# Patient Record
Sex: Female | Born: 1955 | Race: White | Hispanic: No | Marital: Married | Smoking: Never smoker
Health system: Southern US, Community
[De-identification: ages and names within clinical notes are randomized; demographics above are authoritative.]

## PROBLEM LIST (undated history)

## (undated) DIAGNOSIS — B019 Varicella without complication: Secondary | ICD-10-CM

## (undated) DIAGNOSIS — Z87440 Personal history of urinary (tract) infections: Secondary | ICD-10-CM

## (undated) DIAGNOSIS — E785 Hyperlipidemia, unspecified: Secondary | ICD-10-CM

## (undated) DIAGNOSIS — M810 Age-related osteoporosis without current pathological fracture: Secondary | ICD-10-CM

## (undated) HISTORY — DX: Hyperlipidemia, unspecified: E78.5

## (undated) HISTORY — DX: Age-related osteoporosis without current pathological fracture: M81.0

## (undated) HISTORY — DX: Personal history of urinary (tract) infections: Z87.440

## (undated) HISTORY — DX: Varicella without complication: B01.9

---

## 1998-04-29 ENCOUNTER — Other Ambulatory Visit: Admission: RE | Admit: 1998-04-29 | Discharge: 1998-04-29 | Payer: Self-pay | Admitting: Family Medicine

## 1999-04-29 ENCOUNTER — Other Ambulatory Visit: Admission: RE | Admit: 1999-04-29 | Discharge: 1999-04-29 | Payer: Self-pay | Admitting: Family Medicine

## 2012-05-04 HISTORY — PX: BILATERAL SALPINGOOPHORECTOMY: SHX1223

## 2013-03-07 ENCOUNTER — Ambulatory Visit (INDEPENDENT_AMBULATORY_CARE_PROVIDER_SITE_OTHER): Payer: BC Managed Care – PPO | Admitting: Internal Medicine

## 2013-03-07 ENCOUNTER — Encounter: Payer: Self-pay | Admitting: Internal Medicine

## 2013-03-07 VITALS — BP 116/80 | HR 64 | Temp 98.2°F | Ht 62.75 in | Wt 154.0 lb

## 2013-03-07 DIAGNOSIS — M949 Disorder of cartilage, unspecified: Secondary | ICD-10-CM

## 2013-03-07 DIAGNOSIS — T887XXA Unspecified adverse effect of drug or medicament, initial encounter: Secondary | ICD-10-CM | POA: Insufficient documentation

## 2013-03-07 DIAGNOSIS — E785 Hyperlipidemia, unspecified: Secondary | ICD-10-CM | POA: Insufficient documentation

## 2013-03-07 DIAGNOSIS — M8580 Other specified disorders of bone density and structure, unspecified site: Secondary | ICD-10-CM

## 2013-03-07 DIAGNOSIS — E559 Vitamin D deficiency, unspecified: Secondary | ICD-10-CM | POA: Insufficient documentation

## 2013-03-07 DIAGNOSIS — M899 Disorder of bone, unspecified: Secondary | ICD-10-CM

## 2013-03-07 NOTE — Progress Notes (Signed)
Chief Complaint  Patient presents with  . Establish Care    Needs to establish with a PCP.  Has been on cholesterol medication in the past.  Stopped due to mucle aches and fatgue.    HPI: Patient comes in as new patient visit . Previous care was  In [place]  See hx sheet  orig from [place] and then [place] and now back for husbands job .  Lipid : Initial 232  Then 125  lipitor and 180 range on prava   .  Began on lipitpor and did a good job with numbers but by october had muscle achiness so tried  Pravastatin 10  since feb  And then October  Achy agin so stopped for the last 3 months . Wants to get checked ? What to do . She hasn't been eating and exercises as well because of the recent move but knows what to do. She has no established heart disease diabetes or other vascular disease. There is no family history of premature vascular disease mom is 45 on cholesterol medicine and has hypertension father died complication of rheumatic heart disease at age 65 brother died of lung cancer he was a smoker.  .  Vitamin d was in the 18 range when evaluated 2 years ago for acute chest pain that was felt to be stress she is on OTC vitamin D   2000 per day  ROS: See pertinent positives and negatives per HPI. 12 system review Rash  ? If had shingles  gaver her valtrex. Or not.  Had bilatero salpingooopherectomy  May 14  Cause of roguue period and had cyst . Pathology benign however they left the uterus in she's had some hot flashes although they're getting better and was told at some point taking the over-the-counter cohosh and Astra van. She is really taking it at this time. Hasn't set up with a gynecologist yet since how  Past Medical History  Diagnosis Date  . Hyperlipidemia   . Hx: UTI (urinary tract infection)   . Chicken pox     Family History  Problem Relation Age of Onset  . Hyperlipidemia Mother     age 4 currently alive  . Heart disease Mother   . Hypertension Mother   .  Hyperlipidemia Father   . Heart disease Father     rheumatic died 40  . Cancer Brother     Lung    History   Social History  . Marital Status: Married    Spouse Name: N/A    Number of Children: N/A  . Years of Education: N/A   Social History Main Topics  . Smoking status: Never Smoker   . Smokeless tobacco: Never Used  . Alcohol Use: Yes     Comment: Drinks wine at dinner  . Drug Use: No  . Sexual Activity: Yes   Other Topics Concern  . None   Social History Narrative   7-8 hours of sleep per night   2 people living in the home   No pets   Originally from [place] moved to [place] and in [place] back in [place]   Husbands job [employer]   bs degree   G3P3 kids out of house    Neg ets FA  up to 7 wine per week     Outpatient Encounter Prescriptions as of 03/07/2013  Medication Sig  . aspirin 81 MG tablet Take 81 mg by mouth daily.  . Calcium Citrate (CITRACAL  PO) Take by mouth.  . Cholecalciferol (VITAMIN D3) 1000 UNITS CAPS Take by mouth.  . Multiple Vitamins-Minerals (CENTRUM SILVER ADULT 50+ PO) Take by mouth.    EXAM:  BP 116/80  Pulse 64  Temp(Src) 98.2 F (36.8 C) (Oral)  Ht 5' 2.75" (1.594 m)  Wt 154 lb (69.854 kg)  BMI 27.49 kg/m2  SpO2 98%  LMP 01/04/2010  Body mass index is 27.49 kg/(m^2). Physical Exam: Vital signs reviewed ZOX:WRUEGEN:This is a well-developed well-nourished alert cooperative  female who appears her stated age in no acute distress.  HEENT: normocephalic atraumatic , Eyes: PERRL EOM's full, conjunctiva clear, Nares: paten,t no deformity discharge or tenderness., Ears: no deformity EAC's clear TMs with normal landmarks. Mouth: clear OP, no lesions, edema.  Moist mucous membranes. NECK: supple without masses, thyromegaly or bruits. CHEST/PULM:  Clear to auscultation and percussion breath sounds equal no wheeze , rales or rhonchi.CV: PMI is nondisplaced, S1 S2 no gallops, murmurs, rubs. Peripheral pulses are full without  delay.No JVD .  ABDOMEN: Bowel sounds normal nontender  No guard or rebound, no hepato splenomegal no CVA tenderness.   Extremtities:  No clubbing cyanosis or edema, no acute joint swelling or redness no focal atrophy NEURO:  Oriented x3, cranial nerves 3-12 appear to be intact, no obvious focal weakness,gait within normal limits  SKIN: No acute rashes normal turgor, color, no bruising or petechiae. PSYCH: Oriented, good eye contact, no obvious depression anxiety, cognition and judgment appear normal. LN: no cervicaladenopathy She has laboratory studies and her colonoscopy papers to review from the past.  ASSESSMENT AND PLAN:  Discussed the following assessment and plan:  Other and unspecified hyperlipidemia - Plan: Basic metabolic panel, Lipid panel, TSH, T4, free, Vit D  25 hydroxy (rtn osteoporosis monitoring), Hepatic function panel, Hemoglobin A1c, CBC with Differential, CK  Drug side effects - Plan: Basic metabolic panel, Lipid panel, TSH, T4, free, Vit D  25 hydroxy (rtn osteoporosis monitoring), Hepatic function panel, Hemoglobin A1c, CBC with Differential, CK  Unspecified vitamin D deficiency - Plan: Basic metabolic panel, Lipid panel, TSH, T4, free, Vit D  25 hydroxy (rtn osteoporosis monitoring), Hepatic function panel, Hemoglobin A1c, CBC with Differential, CK  Osteopenia - Plan: Basic metabolic panel, Lipid panel, TSH, T4, free, Vit D  25 hydroxy (rtn osteoporosis monitoring), Hepatic function panel, Hemoglobin A1c, CBC with Differential, CK She is up-to-date on health care maintenance except for mammogram  we'll need Pap smears because she still has a uterus and cervix although her ovaries and fallopian tubes or removed surgically suggest she see a gynecologist because of her recent surgery and they can review her operative pathology notes. -Patient advised to return or notify health care team  if symptoms worsen or persist or new concerns arise.  Patient Instructions    Intensify lifestyle interventions. As dicussed .  Stay on vitamin d . Get app t with GYNE regarding the other issues.  Make a lab appt  For fasting labs  And then plan follow up.    Neta MendsWanda K. Panosh M.D.  Pre visit review using our clinic review tool, if applicable. No additional management support is needed unless otherwise documented below in the visit note. Total visit 45mins > 50% spent counseling and coordinating care

## 2013-03-07 NOTE — Patient Instructions (Signed)
Intensify lifestyle interventions. As dicussed .  Stay on vitamin d . Get app t with GYNE regarding the other issues.  Make a lab appt  For fasting labs  And then plan follow up.

## 2013-03-07 NOTE — Assessment & Plan Note (Signed)
Check level as she had myalgias on statin  Hx of osteopenia

## 2013-03-12 ENCOUNTER — Other Ambulatory Visit (INDEPENDENT_AMBULATORY_CARE_PROVIDER_SITE_OTHER): Payer: BC Managed Care – PPO

## 2013-03-12 DIAGNOSIS — E785 Hyperlipidemia, unspecified: Secondary | ICD-10-CM

## 2013-03-12 DIAGNOSIS — M899 Disorder of bone, unspecified: Secondary | ICD-10-CM

## 2013-03-12 DIAGNOSIS — E559 Vitamin D deficiency, unspecified: Secondary | ICD-10-CM

## 2013-03-12 DIAGNOSIS — M949 Disorder of cartilage, unspecified: Secondary | ICD-10-CM

## 2013-03-12 DIAGNOSIS — T887XXA Unspecified adverse effect of drug or medicament, initial encounter: Secondary | ICD-10-CM

## 2013-03-12 DIAGNOSIS — M8580 Other specified disorders of bone density and structure, unspecified site: Secondary | ICD-10-CM

## 2013-03-12 LAB — CBC WITH DIFFERENTIAL/PLATELET
Basophils Absolute: 0 10*3/uL (ref 0.0–0.1)
Basophils Relative: 0.1 % (ref 0.0–3.0)
EOS ABS: 0.1 10*3/uL (ref 0.0–0.7)
EOS PCT: 1.8 % (ref 0.0–5.0)
HCT: 41.4 % (ref 36.0–46.0)
Hemoglobin: 13.9 g/dL (ref 12.0–15.0)
LYMPHS PCT: 31.1 % (ref 12.0–46.0)
Lymphs Abs: 1.4 10*3/uL (ref 0.7–4.0)
MCHC: 33.7 g/dL (ref 30.0–36.0)
MCV: 91.8 fl (ref 78.0–100.0)
MONO ABS: 0.3 10*3/uL (ref 0.1–1.0)
Monocytes Relative: 7.3 % (ref 3.0–12.0)
NEUTROS PCT: 59.7 % (ref 43.0–77.0)
Neutro Abs: 2.7 10*3/uL (ref 1.4–7.7)
PLATELETS: 274 10*3/uL (ref 150.0–400.0)
RBC: 4.51 Mil/uL (ref 3.87–5.11)
RDW: 14.1 % (ref 11.5–14.6)
WBC: 4.5 10*3/uL (ref 4.5–10.5)

## 2013-03-12 LAB — BASIC METABOLIC PANEL
BUN: 13 mg/dL (ref 6–23)
CHLORIDE: 106 meq/L (ref 96–112)
CO2: 28 mEq/L (ref 19–32)
Calcium: 9.4 mg/dL (ref 8.4–10.5)
Creatinine, Ser: 0.9 mg/dL (ref 0.4–1.2)
GFR: 67.66 mL/min (ref >60.00)
GLUCOSE: 100 mg/dL — AB (ref 70–99)
POTASSIUM: 4.2 meq/L (ref 3.5–5.1)
Sodium: 143 mEq/L (ref 135–145)

## 2013-03-12 LAB — CK: CK TOTAL: 104 U/L (ref 7–177)

## 2013-03-12 LAB — TSH: TSH: 1.33 u[IU]/mL (ref 0.35–5.50)

## 2013-03-12 LAB — LIPID PANEL
Cholesterol: 278 mg/dL — ABNORMAL HIGH (ref 0–200)
HDL: 53.9 mg/dL (ref >39.00)
LDL CALC: 188 mg/dL — AB (ref 0–99)
Total CHOL/HDL Ratio: 5
Triglycerides: 182 mg/dL — ABNORMAL HIGH (ref 0.0–149.0)
VLDL: 36.4 mg/dL (ref 0.0–40.0)

## 2013-03-12 LAB — HEPATIC FUNCTION PANEL
ALT: 22 U/L (ref 0–35)
AST: 24 U/L (ref 0–37)
Albumin: 4.3 g/dL (ref 3.5–5.2)
Alkaline Phosphatase: 72 U/L (ref 39–117)
Bilirubin, Direct: 0 mg/dL (ref 0.0–0.3)
TOTAL PROTEIN: 7.8 g/dL (ref 6.0–8.3)
Total Bilirubin: 0.7 mg/dL (ref 0.3–1.2)

## 2013-03-12 LAB — HEMOGLOBIN A1C: Hgb A1c MFr Bld: 6 % (ref 4.6–6.5)

## 2013-03-12 LAB — T4, FREE: Free T4: 0.65 ng/dL (ref 0.60–1.60)

## 2013-03-13 LAB — VITAMIN D 25 HYDROXY (VIT D DEFICIENCY, FRACTURES): Vit D, 25-Hydroxy: 49 ng/mL (ref 30–89)

## 2013-03-19 ENCOUNTER — Telehealth: Payer: Self-pay | Admitting: Family Medicine

## 2013-03-19 NOTE — Telephone Encounter (Signed)
Try lovastatin 20 mg per day disp 30 refill x 3  Check lipids  In 2-3 months or prn

## 2013-03-19 NOTE — Telephone Encounter (Signed)
Spoke to the pt and gave her the results of her lab work.  She was recently on pravastatin and stopped it due to joint pain.  Has also been on atorvastatin in the past and had to stop taking it due to joint pain.  Would like to know what medication you advise.  Please advise.  Thanks!

## 2013-03-21 NOTE — Telephone Encounter (Signed)
Spoke to the pt.  She would like to know if there is something else she can take besides a statin medication.  They have caused joint/muscle pain in the past.  Please advise.  Thanks!

## 2013-03-27 NOTE — Telephone Encounter (Signed)
Other medicines have not been shown to affect outcomes but could try zetia 10 1 po qd . Disp 30 refill x 3 and repeat lipids as planned

## 2013-03-28 ENCOUNTER — Other Ambulatory Visit: Payer: Self-pay | Admitting: Family Medicine

## 2013-03-28 NOTE — Telephone Encounter (Signed)
Spoke to the pt.  She became anxious and went ahead and started some pravastatin 10 mg that she had at home.  She will call and change to Zetia if she has joint pain like previous.

## 2013-03-28 NOTE — Telephone Encounter (Signed)
Left a message on home phone for the pt to return my call. 

## 2013-06-05 ENCOUNTER — Ambulatory Visit (INDEPENDENT_AMBULATORY_CARE_PROVIDER_SITE_OTHER): Payer: BC Managed Care – PPO | Admitting: Internal Medicine

## 2013-06-05 ENCOUNTER — Encounter: Payer: Self-pay | Admitting: Internal Medicine

## 2013-06-05 VITALS — BP 134/84 | Temp 98.1°F | Ht 62.75 in | Wt 156.0 lb

## 2013-06-05 DIAGNOSIS — H6091 Unspecified otitis externa, right ear: Secondary | ICD-10-CM

## 2013-06-05 DIAGNOSIS — H60399 Other infective otitis externa, unspecified ear: Secondary | ICD-10-CM

## 2013-06-05 DIAGNOSIS — H9209 Otalgia, unspecified ear: Secondary | ICD-10-CM

## 2013-06-05 MED ORDER — OFLOXACIN 0.3 % OT SOLN: 10.0000 [drp] | Freq: Every day | OTIC | Status: DC
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
## 2013-06-05 NOTE — Patient Instructions (Signed)
I agree this is external ear infection or swimmers ear .    Otitis Externa Otitis externa is a bacterial or fungal infection of the outer ear canal. This is the area from the eardrum to the outside of the ear. Otitis externa is sometimes called "swimmer's ear." CAUSES  Possible causes of infection include:  Swimming in dirty water.  Moisture remaining in the ear after swimming or bathing.  Mild injury (trauma) to the ear.  Objects stuck in the ear (foreign body).  Cuts or scrapes (abrasions) on the outside of the ear. SYMPTOMS  The first symptom of infection is often itching in the ear canal. Later signs and symptoms may include swelling and redness of the ear canal, ear pain, and yellowish-white fluid (pus) coming from the ear. The ear pain may be worse when pulling on the earlobe. DIAGNOSIS  Your caregiver will perform a physical exam. A sample of fluid may be taken from the ear and examined for bacteria or fungi. TREATMENT  Antibiotic ear drops are often given for 10 to 14 days. Treatment may also include pain medicine or corticosteroids to reduce itching and swelling. PREVENTION   Keep your ear dry. Use the corner of a towel to absorb water out of the ear canal after swimming or bathing.  Avoid scratching or putting objects inside your ear. This can damage the ear canal or remove the protective wax that lines the canal. This makes it easier for bacteria and fungi to grow.  Avoid swimming in lakes, polluted water, or poorly chlorinated pools.  You may use ear drops made of rubbing alcohol and vinegar after swimming. Combine equal parts of white vinegar and alcohol in a bottle. Put 3 or 4 drops into each ear after swimming. HOME CARE INSTRUCTIONS   Apply antibiotic ear drops to the ear canal as prescribed by your caregiver.  Only take over-the-counter or prescription medicines for pain, discomfort, or fever as directed by your caregiver.  If you have diabetes, follow any  additional treatment instructions from your caregiver.  Keep all follow-up appointments as directed by your caregiver. SEEK MEDICAL CARE IF:   You have a fever.  Your ear is still red, swollen, painful, or draining pus after 3 days.  Your redness, swelling, or pain gets worse.  You have a severe headache.  You have redness, swelling, pain, or tenderness in the area behind your ear. MAKE SURE YOU:   Understand these instructions.  Will watch your condition.  Will get help right away if you are not doing well or get worse. Document Released: 12/21/2004 Document Revised: 03/15/2011 Document Reviewed: 01/07/2011 [facility] Patient Information 2014 [place], [place].

## 2013-06-28 ENCOUNTER — Other Ambulatory Visit (INDEPENDENT_AMBULATORY_CARE_PROVIDER_SITE_OTHER): Payer: BC Managed Care – PPO

## 2013-06-28 DIAGNOSIS — Z0000 Encounter for general adult medical examination without abnormal findings: Secondary | ICD-10-CM

## 2013-06-28 LAB — LIPID PANEL
CHOL/HDL RATIO: 4
Cholesterol: 218 mg/dL — ABNORMAL HIGH (ref 0–200)
HDL: 50.3 mg/dL (ref >39.00)
LDL CALC: 116 mg/dL — AB (ref 0–99)
NONHDL: 167.7
Triglycerides: 258 mg/dL — ABNORMAL HIGH (ref 0.0–149.0)
VLDL: 51.6 mg/dL — AB (ref 0.0–40.0)

## 2013-06-28 LAB — BASIC METABOLIC PANEL
BUN: 13 mg/dL (ref 6–23)
CO2: 29 meq/L (ref 19–32)
CREATININE: 0.9 mg/dL (ref 0.4–1.2)
Calcium: 9.3 mg/dL (ref 8.4–10.5)
Chloride: 107 mEq/L (ref 96–112)
GFR: 73.12 mL/min (ref >60.00)
GLUCOSE: 85 mg/dL (ref 70–99)
Potassium: 4.1 mEq/L (ref 3.5–5.1)
Sodium: 141 mEq/L (ref 135–145)

## 2013-06-28 LAB — CBC WITH DIFFERENTIAL/PLATELET
Basophils Absolute: 0 10*3/uL (ref 0.0–0.1)
Basophils Relative: 0.4 % (ref 0.0–3.0)
Eosinophils Absolute: 0.1 10*3/uL (ref 0.0–0.7)
Eosinophils Relative: 2.3 % (ref 0.0–5.0)
HCT: 40.1 % (ref 36.0–46.0)
HEMOGLOBIN: 13.6 g/dL (ref 12.0–15.0)
LYMPHS PCT: 39 % (ref 12.0–46.0)
Lymphs Abs: 1.7 10*3/uL (ref 0.7–4.0)
MCHC: 33.9 g/dL (ref 30.0–36.0)
MCV: 91.5 fl (ref 78.0–100.0)
MONOS PCT: 8.1 % (ref 3.0–12.0)
Monocytes Absolute: 0.4 10*3/uL (ref 0.1–1.0)
NEUTROS ABS: 2.2 10*3/uL (ref 1.4–7.7)
NEUTROS PCT: 50.2 % (ref 43.0–77.0)
Platelets: 253 10*3/uL (ref 150.0–400.0)
RBC: 4.38 Mil/uL (ref 3.87–5.11)
RDW: 12.8 % (ref 11.5–15.5)
WBC: 4.4 10*3/uL (ref 4.0–10.5)

## 2013-06-28 LAB — HEPATIC FUNCTION PANEL
ALK PHOS: 68 U/L (ref 39–117)
ALT: 20 U/L (ref 0–35)
AST: 25 U/L (ref 0–37)
Albumin: 4.3 g/dL (ref 3.5–5.2)
Bilirubin, Direct: 0 mg/dL (ref 0.0–0.3)
Total Bilirubin: 0.4 mg/dL (ref 0.2–1.2)
Total Protein: 7.4 g/dL (ref 6.0–8.3)

## 2013-06-28 LAB — TSH: TSH: 0.36 u[IU]/mL (ref 0.35–4.50)

## 2013-07-12 ENCOUNTER — Other Ambulatory Visit: Payer: Self-pay | Admitting: Family Medicine

## 2013-07-12 ENCOUNTER — Telehealth: Payer: Self-pay | Admitting: Family Medicine

## 2013-07-12 DIAGNOSIS — E785 Hyperlipidemia, unspecified: Secondary | ICD-10-CM

## 2013-07-12 NOTE — Telephone Encounter (Signed)
Spoke to the pt.  She agreed to nutrition referral.  Order placed in the system.  She would like to know if she should increase her pravastatin (PRAVACHOL) 10 MG tablet.  Will try to work on exercising.

## 2013-07-12 NOTE — Telephone Encounter (Signed)
Try increase pravastatin to 20 mg per daydisp 90 refill x 1   procedd with nutrition referral.  Recheck lipid panel and then rov in3-4 months

## 2013-07-13 ENCOUNTER — Other Ambulatory Visit: Payer: Self-pay | Admitting: Family Medicine

## 2013-07-13 DIAGNOSIS — E785 Hyperlipidemia, unspecified: Secondary | ICD-10-CM

## 2013-07-13 MED ORDER — PRAVASTATIN SODIUM 20 MG PO TABS: 20.0000 mg | ORAL_TABLET | Freq: Every day | ORAL | Status: DC

## 2013-07-13 NOTE — Telephone Encounter (Signed)
Pt notified.  New rx sent to the pharmacy and she has made her future lab appt for 11/07/2013 @ 8:15.

## 2013-07-16 ENCOUNTER — Encounter: Payer: BC Managed Care – PPO | Attending: Internal Medicine | Admitting: Dietician

## 2013-07-16 ENCOUNTER — Encounter: Payer: Self-pay | Admitting: Dietician

## 2013-07-16 VITALS — Ht 63.0 in | Wt 155.0 lb

## 2013-07-16 DIAGNOSIS — E785 Hyperlipidemia, unspecified: Secondary | ICD-10-CM | POA: Diagnosis present

## 2013-07-16 NOTE — Patient Instructions (Addendum)
Find a workout that you enjoy! -Yoga -Biking -Swimming (aerobics) -Hiking -Zumba  -Limit alcohol intake to 1 serving per day

## 2013-07-16 NOTE — Progress Notes (Signed)
  Medical Nutrition Therapy:  Appt start time: 1545 end time:  1630.   Assessment:  Primary concerns today: Samantha Adkins is here today to discuss her weight and hyperlipidemia. She reports that she has struggled with her blood lipid levels for several years. She is currently trying pravastatin for the second time, as she has experienced adverse reactions to statins in the past per patient report. Samantha Adkins expressed concern that her strong family history of high cholesterol and postmenopausal status are contributing to her high cholesterol despite healthy diet.  Preferred Learning Style:   No preference indicated   Learning Readiness:   Ready  Change in progress   MEDICATIONS: pravastatin   DIETARY INTAKE:  24-hr recall:  B ( AM): green tea with mint, oatmeal with banana, flaxseed, and walnuts OR 2 pieces of cinnamon raisin toast  Snk ( AM):   L ( PM): tomato slices with rotisserie chicken with cheese and onion with 2 pieces of whole grain bread Snk ( PM): large apple and almonds D ( PM): spaghetti squash with black beans, tomato, olive oil, mushrooms, basil, avocado OR salad with olive oil and balsamic vinegar Snk ( PM): 1 to 2 pieces of Swiss dark chocolate  Beverages: green tea with mint, red wine, water, light vanilla almond milk   Usual physical activity: daily housework and walking; just started yoga and swimming  Estimated energy needs: 1600-1800 calories 180-200 g carbohydrates 120-135 g protein 44-50 g fat  Progress Towards Goal(s):  In progress.   Nutritional Diagnosis:  Wineglass-2.2 Altered nutrition-related laboratory As related to physical inactivity and family history of hyperlipidemia.  As evidenced by TGs 258,  LDL 116, and TC 218.    Intervention:  Nutrition counseling provided. Encouraged patient to reduce alcohol intake and increase exercise.  Teaching Method Utilized:  Auditory  Handouts given during visit include:  Cholesterol and TGs  handout  Barriers to learning/adherence to lifestyle change: none  Demonstrated degree of understanding via:  Teach Back   Monitoring/Evaluation:  Dietary intake, exercise, and body weight prn.

## 2013-10-19 ENCOUNTER — Other Ambulatory Visit: Payer: Self-pay

## 2013-11-05 ENCOUNTER — Encounter: Payer: Self-pay | Admitting: Dietician

## 2013-11-07 ENCOUNTER — Other Ambulatory Visit (INDEPENDENT_AMBULATORY_CARE_PROVIDER_SITE_OTHER): Payer: BC Managed Care – PPO

## 2013-11-07 DIAGNOSIS — E785 Hyperlipidemia, unspecified: Secondary | ICD-10-CM

## 2013-11-07 LAB — LIPID PANEL
CHOLESTEROL: 237 mg/dL — AB (ref 0–200)
HDL: 60.9 mg/dL (ref >39.00)
LDL Cholesterol: 145 mg/dL — ABNORMAL HIGH (ref 0–99)
NonHDL: 176.1
TRIGLYCERIDES: 157 mg/dL — AB (ref 0.0–149.0)
Total CHOL/HDL Ratio: 4
VLDL: 31.4 mg/dL (ref 0.0–40.0)

## 2013-11-19 MED ORDER — PRAVASTATIN SODIUM 40 MG PO TABS: 40.0000 mg | ORAL_TABLET | Freq: Every day | ORAL | Status: DC

## 2013-11-19 NOTE — Addendum Note (Signed)
Addended by: Raj JanusADKINS, Shalona Harbour T on: 11/19/2013 10:21 AM   Modules accepted: Orders, Medications

## 2013-12-19 ENCOUNTER — Other Ambulatory Visit: Payer: Self-pay

## 2013-12-19 DIAGNOSIS — Z1231 Encounter for screening mammogram for malignant neoplasm of breast: Secondary | ICD-10-CM

## 2014-01-06 ENCOUNTER — Other Ambulatory Visit: Payer: Self-pay | Admitting: Internal Medicine

## 2014-01-07 NOTE — Telephone Encounter (Signed)
Denied.  Filled on 11/19/13 for 6 months

## 2014-01-11 ENCOUNTER — Ambulatory Visit
Admission: RE | Admit: 2014-01-11 | Discharge: 2014-01-11 | Disposition: A | Discharge status: Home / Self Care | Payer: BLUE CROSS/BLUE SHIELD | Source: Ambulatory Visit

## 2014-01-11 DIAGNOSIS — Z1231 Encounter for screening mammogram for malignant neoplasm of breast: Secondary | ICD-10-CM

## 2014-04-22 ENCOUNTER — Telehealth: Payer: Self-pay | Admitting: Internal Medicine

## 2014-04-22 NOTE — Telephone Encounter (Signed)
Pt is scheduled for a physical with labs in August. She is asking if she needs to have any labs done before she see Dr Fabian SharpPanosh in August.

## 2014-04-23 ENCOUNTER — Other Ambulatory Visit: Payer: Self-pay | Admitting: Family Medicine

## 2014-04-23 DIAGNOSIS — Z0000 Encounter for general adult medical examination without abnormal findings: Secondary | ICD-10-CM

## 2014-04-23 NOTE — Telephone Encounter (Signed)
Only cpx labs will be needed.  Orders placed in the system.

## 2014-04-23 NOTE — Telephone Encounter (Signed)
S/w pt

## 2014-05-16 ENCOUNTER — Ambulatory Visit (INDEPENDENT_AMBULATORY_CARE_PROVIDER_SITE_OTHER): Payer: BLUE CROSS/BLUE SHIELD | Admitting: Internal Medicine

## 2014-05-16 ENCOUNTER — Encounter: Payer: Self-pay | Admitting: Internal Medicine

## 2014-05-16 VITALS — BP 112/72 | Temp 98.3°F | Ht 62.5 in | Wt 152.4 lb

## 2014-05-16 DIAGNOSIS — Z79899 Other long term (current) drug therapy: Secondary | ICD-10-CM

## 2014-05-16 DIAGNOSIS — M65312 Trigger thumb, left thumb: Secondary | ICD-10-CM

## 2014-05-16 DIAGNOSIS — F4322 Adjustment disorder with anxiety: Secondary | ICD-10-CM

## 2014-05-16 DIAGNOSIS — R29898 Other symptoms and signs involving the musculoskeletal system: Secondary | ICD-10-CM

## 2014-05-16 DIAGNOSIS — Z8619 Personal history of other infectious and parasitic diseases: Secondary | ICD-10-CM

## 2014-05-16 DIAGNOSIS — E785 Hyperlipidemia, unspecified: Secondary | ICD-10-CM

## 2014-05-16 MED ORDER — VALACYCLOVIR HCL 1 G PO TABS: 2000.0000 mg | ORAL_TABLET | Freq: Two times a day (BID) | ORAL | Status: AC

## 2014-05-16 MED ORDER — ALPRAZOLAM 0.25 MG PO TABS: 0.2500 mg | ORAL_TABLET | Freq: Two times a day (BID) | ORAL | Status: DC | PRN

## 2014-05-16 NOTE — Patient Instructions (Addendum)
This acts like a  Trigger thumb. An may benefit from injection or other  You will be contacted  about a hand referral appt.  Plan wellness visit with  Full labs  ( Can use Friday June 3rd  Work in )

## 2014-05-16 NOTE — Progress Notes (Signed)
Pre visit review using our clinic review tool, if applicable. No additional management support is needed unless otherwise documented below in the visit note.  Chief Complaint  Patient presents with  . Left Thumb Pain    Thumb makes a clicking sound and jaw makes a crackling sound.  Would like refills of valacyclovir and alprazolam.  Would like referral to dermatology and opthalmology.  She would also like to be seen in the next few weeks for CPX and to re check her lipid panel now that pravastatin has been increased.  . Jaw Pain    HPI: Patient Samantha Adkins  comes in today for SDA for  new problem evaluation. Has a number of other issues requests : see above   Onset left thumb  Of problem March 24 or therabouts. Awoke with thumb stuck and click and couldn't do rom  No known injury and couldn't bend . And click and stuck . And aches .  At times   Sometimes ok but then works again has adapted and not using as much cause of this no injury noted   No hx of same .  No hx of arthritis   Out of town for 8 weeks   Plans on being back   [place]   Mom died this past week pulmonary fibrosis idiopathic   Remote hx 2012 of alprazolam 0.25  And had  left over 1 tablet.  From  bro death a few years ago  1/2 helps  Would like to have some on hand; not depressed no reg etoh or rd . Just ocass anxiety .   Gets cold sores  And would likje rx for valtrex used in the past seems to help .  Saw eye doc recently may want to change  Options   Out of town for lots of the summer  Needs cpx   Due for lipid panel and labs   Jaw pain clicks  Right at times  Uncertain if important   Poss teethgritting no injury dental problems  ROS: See pertinent positives and negatives per HPI.  Past Medical History  Diagnosis Date  . Hyperlipidemia   . Hx: UTI (urinary tract infection)   . Chicken pox     Family History  Problem Relation Age of Onset  . Hyperlipidemia Mother     age 59 passed 2016  . Heart disease  Mother   . Hypertension Mother   . Hyperlipidemia Father   . Heart disease Father     rheumatic died 3957  . Cancer Brother     Lung deceased 2012  . Pulmonary fibrosis Mother     History   Social History  . Marital Status: Married    Spouse Name: N/A  . Number of Children: N/A  . Years of Education: N/A   Social History Main Topics  . Smoking status: Never Smoker   . Smokeless tobacco: Never Used  . Alcohol Use: Yes     Comment: Drinks wine at dinner  . Drug Use: No  . Sexual Activity: Yes   Other Topics Concern  . None   Social History Narrative   7-8 hours of sleep per night   2 people living in the home   No pets   [place]   Husbands job [employer]   bs degree   G3P3 kids out of house    Neg ets FA  up to  7 wine per week     Outpatient Prescriptions Prior to Visit  Medication Sig Dispense Refill  . aspirin 81 MG tablet Take 81 mg by mouth daily.    . Calcium Citrate (CITRACAL PO) Take by mouth.    . Cholecalciferol (VITAMIN D3) 1000 UNITS CAPS Take by mouth.    . Coenzyme Q10 (CO Q 10 PO) Take by mouth.    . Multiple Vitamins-Minerals (CENTRUM SILVER ADULT 50+ PO) Take by mouth.    Marland Kitchen. ofloxacin (FLOXIN) 0.3 % otic solution Place 10 drops into the right ear daily. For 7 days 5 mL 1  . pravastatin (PRAVACHOL) 40 MG tablet Take 1 tablet (40 mg total) by mouth daily. 90 tablet 1   No facility-administered medications prior to visit.     EXAM:  BP 112/72 mmHg  Temp(Src) 98.3 F (36.8 C) (Oral)  Ht 5' 2.5" (1.588 m)  Wt 152 lb 6.4 oz (69.128 kg)  BMI 27.41 kg/m2  LMP 01/04/2010  Body mass index is 27.41 kg/(m^2).  GENERAL: vitals reviewed and listed above, alert, oriented, appears well hydrated and in no acute distress HEENT: atraumatic, conjunctiva  clear, no obvious abnormalities on inspection of external nose and ears NECK: no obvious masses on inspection palpation  LMS:  moves all extremities x Left thumb  Clicking tendon but can do rom at this time  mintendernss  PSYCH: pleasant and cooperative, no obvious depression or anxiety  ASSESSMENT AND PLAN:  Discussed the following assessment and plan:  Trigger thumb, left - can try nsaid and dec use but lasting for 3 months refer to hand - Plan: Ambulatory referral to Hand Surgery  Jaw clicking - if problematic can see dentist but prob not sig   Adjustment disorder with anxious mood - risk benefit of med not with etoh rare use mom just died   if persistnet then plan other intervention  Hx of cold sores  Medication management - valtrex with explanation  Hyperlipidemia - due for labs and wellness  resced to earlier in summer   -Patient advised to return or notify health care team  if symptoms worsen ,persist or new concerns arise.  Patient Instructions  This acts like a  Trigger thumb. An may benefit from injection or other  You will be contacted  about a hand referral appt.  Plan wellness visit with  Full labs  ( Can use Friday June 3rd  Work in )     [place]lingdale[place]anda K. Jay Haskew M.D.

## 2014-05-31 ENCOUNTER — Other Ambulatory Visit (INDEPENDENT_AMBULATORY_CARE_PROVIDER_SITE_OTHER): Payer: BLUE CROSS/BLUE SHIELD

## 2014-05-31 DIAGNOSIS — Z0000 Encounter for general adult medical examination without abnormal findings: Secondary | ICD-10-CM

## 2014-05-31 LAB — LIPID PANEL
CHOLESTEROL: 215 mg/dL — AB (ref 0–200)
HDL: 59.6 mg/dL (ref >39.00)
LDL CALC: 130 mg/dL — AB (ref 0–99)
NonHDL: 155.4
Total CHOL/HDL Ratio: 4
Triglycerides: 127 mg/dL (ref 0.0–149.0)
VLDL: 25.4 mg/dL (ref 0.0–40.0)

## 2014-05-31 LAB — CBC WITH DIFFERENTIAL/PLATELET
Basophils Absolute: 0 10*3/uL (ref 0.0–0.1)
Basophils Relative: 0.5 % (ref 0.0–3.0)
EOS ABS: 0.1 10*3/uL (ref 0.0–0.7)
Eosinophils Relative: 2.3 % (ref 0.0–5.0)
HEMATOCRIT: 42.8 % (ref 36.0–46.0)
Hemoglobin: 14.5 g/dL (ref 12.0–15.0)
LYMPHS ABS: 1.7 10*3/uL (ref 0.7–4.0)
Lymphocytes Relative: 36.5 % (ref 12.0–46.0)
MCHC: 33.9 g/dL (ref 30.0–36.0)
MCV: 89.9 fl (ref 78.0–100.0)
MONOS PCT: 8.3 % (ref 3.0–12.0)
Monocytes Absolute: 0.4 10*3/uL (ref 0.1–1.0)
Neutro Abs: 2.5 10*3/uL (ref 1.4–7.7)
Neutrophils Relative %: 52.4 % (ref 43.0–77.0)
Platelets: 261 10*3/uL (ref 150.0–400.0)
RBC: 4.76 Mil/uL (ref 3.87–5.11)
RDW: 13 % (ref 11.5–15.5)
WBC: 4.8 10*3/uL (ref 4.0–10.5)

## 2014-05-31 LAB — BASIC METABOLIC PANEL
BUN: 14 mg/dL (ref 6–23)
CO2: 30 mEq/L (ref 19–32)
Calcium: 9.7 mg/dL (ref 8.4–10.5)
Chloride: 104 mEq/L (ref 96–112)
Creatinine, Ser: 0.95 mg/dL (ref 0.40–1.20)
GFR: 64.1 mL/min (ref >60.00)
Glucose, Bld: 105 mg/dL — ABNORMAL HIGH (ref 70–99)
POTASSIUM: 4.7 meq/L (ref 3.5–5.1)
Sodium: 140 mEq/L (ref 135–145)

## 2014-05-31 LAB — HEPATIC FUNCTION PANEL
ALK PHOS: 72 U/L (ref 39–117)
ALT: 14 U/L (ref 0–35)
AST: 18 U/L (ref 0–37)
Albumin: 4.4 g/dL (ref 3.5–5.2)
BILIRUBIN DIRECT: 0.1 mg/dL (ref 0.0–0.3)
Total Bilirubin: 0.6 mg/dL (ref 0.2–1.2)
Total Protein: 7.7 g/dL (ref 6.0–8.3)

## 2014-05-31 LAB — TSH: TSH: 1.98 u[IU]/mL (ref 0.35–4.50)

## 2014-06-07 ENCOUNTER — Ambulatory Visit (INDEPENDENT_AMBULATORY_CARE_PROVIDER_SITE_OTHER): Payer: BLUE CROSS/BLUE SHIELD | Admitting: Internal Medicine

## 2014-06-07 ENCOUNTER — Encounter: Payer: Self-pay | Admitting: Internal Medicine

## 2014-06-07 VITALS — BP 114/74 | Temp 98.2°F | Ht 62.0 in | Wt 153.1 lb

## 2014-06-07 DIAGNOSIS — Z0000 Encounter for general adult medical examination without abnormal findings: Secondary | ICD-10-CM

## 2014-06-07 DIAGNOSIS — Z8249 Family history of ischemic heart disease and other diseases of the circulatory system: Secondary | ICD-10-CM

## 2014-06-07 DIAGNOSIS — E785 Hyperlipidemia, unspecified: Secondary | ICD-10-CM

## 2014-06-07 DIAGNOSIS — R7301 Impaired fasting glucose: Secondary | ICD-10-CM | POA: Diagnosis not present

## 2014-06-07 DIAGNOSIS — Z79899 Other long term (current) drug therapy: Secondary | ICD-10-CM

## 2014-06-07 NOTE — Patient Instructions (Signed)
Continue lifestyle intervention healthy eating and exercise .  Will look into getting a coronary artery calcium.  Score which may help us decide risk beneft of statin medication.     Why follow it? Research shows. . Those who follow the Mediterranean diet have a reduced risk of heart disease  . The diet is associated with a reduced incidence of Parkinson's and Alzheimer's diseases . People following the diet may have longer life expectancies and lower rates of chronic diseases  . The Dietary Guidelines for Americans recommends the Mediterranean diet as an eating plan to promote health and prevent disease  What Is the Mediterranean Diet?  . Healthy eating plan based on typical foods and recipes of Mediterranean-style cooking . The diet is primarily a plant based diet; these foods should make up a majority of meals   Starches - Plant based foods should make up a majority of meals - They are an important sources of vitamins, minerals, energy, antioxidants, and fiber - Choose whole grains, foods high in fiber and minimally processed items  - Typical grain sources include wheat, oats, barley, corn, [place] rice, bulgar, farro, millet, polenta, couscous  - Various types of beans include chickpeas, lentils, fava beans, black beans, white beans   Fruits  Veggies - Large quantities of antioxidant rich fruits & veggies; 6 or more servings  - Vegetables can be eaten raw or lightly drizzled with oil and cooked  - Vegetables common to the traditional Mediterranean Diet include: artichokes, arugula, beets, broccoli, brussel sprouts, cabbage, carrots, celery, collard greens, cucumbers, eggplant, kale, leeks, lemons, lettuce, mushrooms, okra, onions, peas, peppers, potatoes, pumpkin, radishes, rutabaga, shallots, spinach, sweet potatoes, turnips, zucchini - Fruits common to the Mediterranean Diet include: apples, apricots, avocados, cherries, clementines, dates, figs, grapefruits, grapes, melons, nectarines,  oranges, peaches, pears, pomegranates, strawberries, tangerines  Fats - Replace butter and margarine with healthy oils, such as olive oil, canola oil, and tahini  - Limit nuts to no more than a handful a day  - Nuts include walnuts, almonds, pecans, pistachios, pine nuts  - Limit or avoid candied, honey roasted or heavily salted nuts - Olives are central to the [place]iterranean diet - can be eaten whole or used in a variety of dishes   Meats Protein - Limiting red meat: no more than a few times a month - When eating red meat: choose lean cuts and keep the portion to the size of deck of cards - Eggs: approx. 0 to 4 times a week  - Fish and lean poultry: at least 2 a week  - Healthy protein sources include, chicken, [place], lean beef, lamb - Increase intake of seafood such as tuna, salmon, trout, mackerel, shrimp, scallops - Avoid or limit high fat processed meats such as sausage and bacon  Dairy - Include moderate amounts of low fat dairy products  - Focus on healthy dairy such as fat free yogurt, skim milk, low or reduced fat cheese - Limit dairy products higher in fat such as whole or 2% milk, cheese, ice cream  Alcohol - Moderate amounts of red wine is ok  - No more than 5 oz daily for women (all ages) and men older than age 59  - No more than 10 oz of wine daily for men younger than 3865  Other - Limit sweets and other desserts  - Use herbs and spices instead of salt to flavor foods  - Herbs and spices common to the traditional Mediterranean Diet include: basil, bay leaves,  chives, cloves, cumin, fennel, garlic, lavender, marjoram, mint, oregano, parsley, pepper, rosemary, sage, savory, sumac, tarragon, thyme   It's not just a diet, it's a lifestyle:  . The Mediterranean diet includes lifestyle factors typical of those in the region  . Foods, drinks and meals are best eaten with others and savored . Daily physical activity is important for overall good health . This could be strenuous  exercise like running and aerobics . This could also be more leisurely activities such as walking, housework, yard-work, or taking the stairs . Moderation is the key; a balanced and healthy diet accommodates most foods and drinks . Consider portion sizes and frequency of consumption of certain foods   Meal Ideas & Options:  . Breakfast:  o Whole wheat toast or whole wheat English muffins with peanut butter & hard boiled egg o Steel cut oats topped with apples & cinnamon and skim milk  o Fresh fruit: banana, strawberries, melon, berries, peaches  o Smoothies: strawberries, bananas, greek yogurt, peanut butter o Low fat greek yogurt with blueberries and granola  o Egg white omelet with spinach and mushrooms o Breakfast couscous: whole wheat couscous, apricots, skim milk, cranberries  . Sandwiches:  o Hummus and grilled vegetables (peppers, zucchini, squash) on whole wheat bread   o Grilled chicken on whole wheat pita with lettuce, tomatoes, cucumbers or tzatziki  o Tuna salad on whole wheat bread: tuna salad made with greek yogurt, olives, red peppers, capers, green onions o Garlic rosemary lamb pita: lamb sauted with garlic, rosemary, salt & pepper; add lettuce, cucumber, greek yogurt to pita - flavor with lemon juice and black pepper  . Seafood:  o Mediterranean grilled salmon, seasoned with garlic, basil, parsley, lemon juice and black pepper o Shrimp, lemon, and spinach whole-grain pasta salad made with low fat greek yogurt  o Seared scallops with lemon orzo  o Seared tuna steaks seasoned salt, pepper, coriander topped with tomato mixture of olives, tomatoes, olive oil, minced garlic, parsley, green onions and cappers  . Meats:  o Herbed greek chicken salad with kalamata olives, cucumber, feta  o Red bell peppers stuffed with spinach, bulgur, lean ground beef (or lentils) & topped with feta   o Kebabs: skewers of chicken, tomatoes, onions, zucchini, squash  o [place] burgers: made with  red onions, mint, dill, lemon juice, feta cheese topped with roasted red peppers . Vegetarian o Cucumber salad: cucumbers, artichoke hearts, celery, red onion, feta cheese, tossed in olive oil & lemon juice  o Hummus and whole grain pita points with a greek salad (lettuce, tomato, feta, olives, cucumbers, red onion) o Lentil soup with celery, carrots made with vegetable broth, garlic, salt and pepper  o Tabouli salad: parsley, bulgur, mint, scallions, cucumbers, tomato, radishes, lemon juice, olive oil, salt and pepper.

## 2014-06-07 NOTE — Progress Notes (Signed)
Pre visit review using our clinic review tool, if applicable. No additional management support is needed unless otherwise documented below in the visit note.  Chief Complaint  Patient presents with  . Annual Exam    HPI: Patient  Samantha Adkins  59 y.o. comes in today for Preventive Health Care visit  Also follow-up of hyperlipidemia now on 40 mg of pravastatin Health Maintenance  Topic Date Due  . HIV Screening  06/05/2015 (Originally 11/29/1970)  . INFLUENZA VACCINE  08/05/2014  . PAP SMEAR  05/05/2015  . MAMMOGRAM  01/12/2016  . COLONOSCOPY  09/04/2017  . TETANUS/TDAP  05/05/2019   Health Maintenance Review LIFESTYLE:  Exercise:  Walking some  No physical lmitattions Tobacco/ETS: Alcohol: per day 1-3 ocass  Sugar beverages:no Sleep:ok Drug use: no  ROS:  Thumb is better after injection  GEN/ HEENT: No fever, significant weight changes sweats headaches vision problems hearing changes, CV/ PULM; No chest pain shortness of breath cough, syncope,edema  change in exercise tolerance. GI /GU: No adominal pain, vomiting, change in bowel habits. No blood in the stool. No significant GU symptoms. SKIN/HEME: ,no acute skin rashes suspicious lesions or bleeding. No lymphadenopathy, nodules, masses.  NEURO/ PSYCH:  No neurologic signs such as weakness numbness. No depression anxiety. Not depressed some sadness with bereavement. IMM/ Allergy: No unusual infections.  Allergy .   REST of 12 system review negative except as per HPI   Past Medical History  Diagnosis Date  . Hyperlipidemia   . Hx: UTI (urinary tract infection)   . Chicken pox     Past Surgical History  Procedure Laterality Date  . Bilateral salpingoophorectomy  May 19, 2012    Family History  Problem Relation Age of Onset  . Hyperlipidemia Mother     age 91 passed 2014/05/20  . Heart disease Mother     stents in 42 s   . Hypertension Mother   . Hyperlipidemia Father   . Heart disease Father     rheumatic died 53 chf     . Cancer Brother     Lung deceased 2010/05/20  . Pulmonary fibrosis Mother   . Atrial fibrillation Mother     History   Social History  . Marital Status: Married    Spouse Name: N/A  . Number of Children: N/A  . Years of Education: N/A   Social History Main Topics  . Smoking status: Never Smoker   . Smokeless tobacco: Never Used  . Alcohol Use: Yes     Comment: Drinks wine at dinner  . Drug Use: No  . Sexual Activity: Yes   Other Topics Concern  . None   Social History Narrative   7-8 hours of sleep per night   2 people living in the home   No pets   Originally from [place] moved to [place] and in [place] back in [place]   Husbands job [employer]   bs degree   G3P3 kids out of house    Neg ets FA  up to 7 wine per week     Outpatient Prescriptions Prior to Visit  Medication Sig Dispense Refill  . ALPRAZolam (XANAX) 0.25 MG tablet Take 1 tablet (0.25 mg total) by mouth 2 (two) times daily as needed for anxiety. 20 tablet 0  . aspirin 81 MG tablet Take 81 mg by mouth daily.    . Calcium Citrate (CITRACAL PO) Take by mouth.    . Cholecalciferol (VITAMIN D3) 1000 UNITS CAPS Take by mouth.    Marland Kitchen  Multiple Vitamins-Minerals (CENTRUM SILVER ADULT 50+ PO) Take by mouth.    . pravastatin (PRAVACHOL) 40 MG tablet Take 1 tablet (40 mg total) by mouth daily. 90 tablet 1  . valACYclovir (VALTREX) 1000 MG tablet Take 2 tablets (2,000 mg total) by mouth 2 (two) times daily. For outbreak 30 tablet 1  . Coenzyme Q10 (CO Q 10 PO) Take by mouth.    Marland Kitchen ofloxacin (FLOXIN) 0.3 % otic solution Place 10 drops into the right ear daily. For 7 days 5 mL 1   No facility-administered medications prior to visit.     EXAM:  BP 114/74 mmHg  Temp(Src) 98.2 F (36.8 C) (Oral)  Ht 5\' 2"  (1.575 m)  Wt 153 lb 1.6 oz (69.446 kg)  BMI 28.00 kg/m2  LMP 01/04/2010  Body mass index is 28 kg/(m^2).  Physical Exam: Vital signs reviewed WUJ:WJXB is a well-developed well-nourished  alert cooperative    who appearsr stated age in no acute distress.  HEENT: normocephalic atraumatic , Eyes: PERRL EOM's full, conjunctiva clear, Nares: paten,t no deformity discharge or tenderness., Ears: no deformity EAC's clear TMs with normal landmarks. Mouth: clear OP, no lesions, edema.  Moist mucous membranes. Dentition in adequate repair. NECK: supple without masses, thyromegaly or bruits. CHEST/PULM:  Clear to auscultation and percussion breath sounds equal no wheeze , rales or rhonchi. No chest wall deformities or tenderness. Breast: normal by inspection . No dimpling, discharge, masses, tenderness or discharge . CV: PMI is nondisplaced, S1 S2 no gallops, murmurs, rubs. Peripheral pulses are full without delay.No JVD .  ABDOMEN: Bowel sounds normal nontender  No guard or rebound, no hepato splenomegal no CVA tenderness.  No hernia. Extremtities:  No clubbing cyanosis or edema, no acute joint swelling or redness no focal atrophy NEURO:  Oriented x3, cranial nerves 3-12 appear to be intact, no obvious focal weakness,gait within normal limits no abnormal reflexes or asymmetrical SKIN: No acute rashes normal turgor, color, no bruising or petechiae. PSYCH: Oriented, good eye contact, no obvious depression anxiety, cognition and judgment appear normal. LN: no cervical axillary inguinal adenopathy  Lab Results  Component Value Date   WBC 4.8 05/31/2014   HGB 14.5 05/31/2014   HCT 42.8 05/31/2014   PLT 261.0 05/31/2014   GLUCOSE 105* 05/31/2014   CHOL 215* 05/31/2014   TRIG 127.0 05/31/2014   HDL 59.60 05/31/2014   LDLCALC 130* 05/31/2014   ALT 14 05/31/2014   AST 18 05/31/2014   NA 140 05/31/2014   K 4.7 05/31/2014   CL 104 05/31/2014   CREATININE 0.95 05/31/2014   BUN 14 05/31/2014   CO2 30 05/31/2014   TSH 1.98 05/31/2014   HGBA1C 6.0 03/12/2013    ASSESSMENT AND PLAN:  Discussed the following assessment and plan:  Visit for preventive health examination  Hyperlipidemia  - Plan: CT Cardiac Scoring  Medication management  Fasting hyperglycemia - Plan: CT Cardiac Scoring  Family history of heart disease - Plan: CT Cardiac Scoring Reviewed risk history at length on 40 mg of pravastatin with minimal improvement from 10-20. Her initial LDL was 187 with no other risk factors except family history. Mom had pulmonary fibrosis and had stents in her 10s She has mild hyperglycemia uncertain if related to statin but did predate treatment.   She asks about particle size is risk assessment also. She did go to the nutritionist and felt that was useless because they told her she was doing everything right. Would suggest coronary artery calcium scoring as a better  next step for risk assessment regarding advisability of continued statin intervention versus lifestyle.   tPatient Care Team: Madelin Headings, MD as PCP - General (Internal Medicine) Patient Instructions   Continue lifestyle intervention healthy eating and exercise .  Will look into getting a coronary artery calcium.  Score which may help [place] decide risk beneft of statin medication.     Why follow it? Research shows. . Those who follow the Mediterranean diet have a reduced risk of heart disease  . The diet is associated with a reduced incidence of Parkinson's and Alzheimer's diseases . People following the diet may have longer life expectancies and lower rates of chronic diseases  . The Dietary Guidelines for Americans recommends the Mediterranean diet as an eating plan to promote health and prevent disease  What Is the Mediterranean Diet?  . Healthy eating plan based on typical foods and recipes of Mediterranean-style cooking . The diet is primarily a plant based diet; these foods should make up a majority of meals   Starches - Plant based foods should make up a majority of meals - They are an important sources of vitamins, minerals, energy, antioxidants, and fiber - Choose whole grains, foods high in fiber and  minimally processed items  - Typical grain sources include wheat, oats, barley, corn, [place] rice, bulgar, farro, millet, polenta, couscous  - Various types of beans include chickpeas, lentils, fava beans, black beans, white beans   Fruits  Veggies - Large quantities of antioxidant rich fruits & veggies; 6 or more servings  - Vegetables can be eaten raw or lightly drizzled with oil and cooked  - Vegetables common to the traditional Mediterranean Diet include: artichokes, arugula, beets, broccoli, brussel sprouts, cabbage, carrots, celery, collard greens, cucumbers, eggplant, kale, leeks, lemons, lettuce, mushrooms, okra, onions, peas, peppers, potatoes, pumpkin, radishes, rutabaga, shallots, spinach, sweet potatoes, turnips, zucchini - Fruits common to the Mediterranean Diet include: apples, apricots, avocados, cherries, clementines, dates, figs, grapefruits, grapes, melons, nectarines, oranges, peaches, pears, pomegranates, strawberries, tangerines  Fats - Replace butter and margarine with healthy oils, such as olive oil, canola oil, and tahini  - Limit nuts to no more than a handful a day  - Nuts include walnuts, almonds, pecans, pistachios, pine nuts  - Limit or avoid candied, honey roasted or heavily salted nuts - Olives are central to the [employer] - can be eaten whole or used in a variety of dishes   Meats Protein - Limiting red meat: no more than a few times a month - When eating red meat: choose lean cuts and keep the portion to the size of deck of cards - Eggs: approx. 0 to 4 times a week  - Fish and lean poultry: at least 2 a week  - Healthy protein sources include, chicken, [place], lean beef, lamb - Increase intake of seafood such as tuna, salmon, trout, mackerel, shrimp, scallops - Avoid or limit high fat processed meats such as sausage and bacon  Dairy - Include moderate amounts of low fat dairy products  - Focus on healthy dairy such as fat free yogurt, skim milk, low or  reduced fat cheese - Limit dairy products higher in fat such as whole or 2% milk, cheese, ice cream  Alcohol - Moderate amounts of red wine is ok  - No more than 5 oz daily for women (all ages) and men older than age 55  - No more than 10 oz of wine daily for men younger than 6  Other -  Limit sweets and other desserts  - Use herbs and spices instead of salt to flavor foods  - Herbs and spices common to the traditional Mediterranean Diet include: basil, bay leaves, chives, cloves, cumin, fennel, garlic, lavender, marjoram, mint, oregano, parsley, pepper, rosemary, sage, savory, sumac, tarragon, thyme   It's not just a diet, it's a lifestyle:  . The Mediterranean diet includes lifestyle factors typical of those in the region  . Foods, drinks and meals are best eaten with others and savored . Daily physical activity is important for overall good health . This could be strenuous exercise like running and aerobics . This could also be more leisurely activities such as walking, housework, yard-work, or taking the stairs . Moderation is the key; a balanced and healthy diet accommodates most foods and drinks . Consider portion sizes and frequency of consumption of certain foods   Meal Ideas & Options:  . Breakfast:  o Whole wheat toast or whole wheat English muffins with peanut butter & hard boiled egg o Steel cut oats topped with apples & cinnamon and skim milk  o Fresh fruit: banana, strawberries, melon, berries, peaches  o Smoothies: strawberries, bananas, greek yogurt, peanut butter o Low fat greek yogurt with blueberries and granola  o Egg white omelet with spinach and mushrooms o Breakfast couscous: whole wheat couscous, apricots, skim milk, cranberries  . Sandwiches:  o Hummus and grilled vegetables (peppers, zucchini, squash) on whole wheat bread   o Grilled chicken on whole wheat pita with lettuce, tomatoes, cucumbers or tzatziki  o Tuna salad on whole wheat bread: tuna salad made  with greek yogurt, olives, red peppers, capers, green onions o Garlic rosemary lamb pita: lamb sauted with garlic, rosemary, salt & pepper; add lettuce, cucumber, greek yogurt to pita - flavor with lemon juice and black pepper  . Seafood:  o Mediterranean grilled salmon, seasoned with garlic, basil, parsley, lemon juice and black pepper o Shrimp, lemon, and spinach whole-grain pasta salad made with low fat greek yogurt  o Seared scallops with lemon orzo  o Seared tuna steaks seasoned salt, pepper, coriander topped with tomato mixture of olives, tomatoes, olive oil, minced garlic, parsley, green onions and cappers  . Meats:  o Herbed greek chicken salad with kalamata olives, cucumber, feta  o Red bell peppers stuffed with spinach, bulgur, lean ground beef (or lentils) & topped with feta   o Kebabs: skewers of chicken, tomatoes, onions, zucchini, squash  o [place] burgers: made with red onions, mint, dill, lemon juice, feta cheese topped with roasted red peppers . Vegetarian o Cucumber salad: cucumbers, artichoke hearts, celery, red onion, feta cheese, tossed in olive oil & lemon juice  o Hummus and whole grain pita points with a greek salad (lettuce, tomato, feta, olives, cucumbers, red onion) o Lentil soup with celery, carrots made with vegetable broth, garlic, salt and pepper  o Tabouli salad: parsley, bulgur, mint, scallions, cucumbers, tomato, radishes, lemon juice, olive oil, salt and pepper.         Neta MendsWanda K. Brandyn Lowrey M.D.

## 2014-06-13 ENCOUNTER — Other Ambulatory Visit: Payer: Self-pay | Admitting: Internal Medicine

## 2014-06-13 NOTE — Telephone Encounter (Signed)
Sent to the pharmacy by e-scribe. 

## 2014-06-19 ENCOUNTER — Inpatient Hospital Stay: Admission: RE | Admit: 2014-06-19 | Payer: BLUE CROSS/BLUE SHIELD | Source: Ambulatory Visit

## 2014-06-26 ENCOUNTER — Ambulatory Visit (INDEPENDENT_AMBULATORY_CARE_PROVIDER_SITE_OTHER)
Admission: RE | Admit: 2014-06-26 | Discharge: 2014-06-26 | Disposition: A | Discharge status: Home / Self Care | Payer: BLUE CROSS/BLUE SHIELD | Source: Ambulatory Visit | Attending: Internal Medicine | Admitting: Internal Medicine

## 2014-06-26 DIAGNOSIS — R7301 Impaired fasting glucose: Secondary | ICD-10-CM

## 2014-06-26 DIAGNOSIS — R911 Solitary pulmonary nodule: Secondary | ICD-10-CM

## 2014-06-26 DIAGNOSIS — Z8249 Family history of ischemic heart disease and other diseases of the circulatory system: Secondary | ICD-10-CM

## 2014-06-26 DIAGNOSIS — E785 Hyperlipidemia, unspecified: Secondary | ICD-10-CM

## 2014-06-27 DIAGNOSIS — R911 Solitary pulmonary nodule: Secondary | ICD-10-CM | POA: Insufficient documentation

## 2014-07-01 ENCOUNTER — Other Ambulatory Visit: Payer: Self-pay | Admitting: Family Medicine

## 2014-07-01 ENCOUNTER — Telehealth: Payer: Self-pay | Admitting: Family Medicine

## 2014-07-01 DIAGNOSIS — E785 Hyperlipidemia, unspecified: Secondary | ICD-10-CM

## 2014-07-01 NOTE — Telephone Encounter (Signed)
Unless having a problem with  Side affects would stay on it mostly because of her family history. However I see no compelling reason to switch and change other medication when her calcium score is 0. That is very reassuring.  Okay to repeat lipid panel in 3 months.  Make sure she is aware of the incidental finding of pulmonary nodules. The radiologist recommends repeat limited CT in a year. To ensure they still look benign and are not progressing. Please make sure these orders are in the system.

## 2014-07-01 NOTE — Telephone Encounter (Signed)
Ok to fasting bg and hga1c then

## 2014-07-01 NOTE — Telephone Encounter (Signed)
Pt working hard on her cholesterol. She would like to re check it in three months.  Calcium score in 0 but lipid panel is abnormal.  Does she need to continue medication?

## 2014-07-01 NOTE — Telephone Encounter (Signed)
Pt is also asking for her blood sugar to be checked due to pre diabetes.  Okay to order?

## 2014-07-02 ENCOUNTER — Other Ambulatory Visit: Payer: Self-pay | Admitting: Family Medicine

## 2014-07-02 DIAGNOSIS — R7303 Prediabetes: Secondary | ICD-10-CM

## 2014-07-02 NOTE — Telephone Encounter (Signed)
Labs ordered.

## 2014-08-19 ENCOUNTER — Other Ambulatory Visit: Payer: BLUE CROSS/BLUE SHIELD

## 2014-08-27 ENCOUNTER — Encounter: Payer: BLUE CROSS/BLUE SHIELD | Admitting: Internal Medicine

## 2014-10-09 ENCOUNTER — Other Ambulatory Visit: Payer: BLUE CROSS/BLUE SHIELD

## 2014-10-18 ENCOUNTER — Other Ambulatory Visit: Payer: BLUE CROSS/BLUE SHIELD

## 2014-11-01 ENCOUNTER — Other Ambulatory Visit: Payer: BLUE CROSS/BLUE SHIELD

## 2014-11-15 ENCOUNTER — Other Ambulatory Visit: Payer: BLUE CROSS/BLUE SHIELD

## 2014-11-22 ENCOUNTER — Other Ambulatory Visit: Payer: BLUE CROSS/BLUE SHIELD

## 2014-12-18 ENCOUNTER — Other Ambulatory Visit (INDEPENDENT_AMBULATORY_CARE_PROVIDER_SITE_OTHER): Payer: BLUE CROSS/BLUE SHIELD

## 2014-12-18 DIAGNOSIS — R7303 Prediabetes: Secondary | ICD-10-CM | POA: Diagnosis not present

## 2014-12-18 DIAGNOSIS — E785 Hyperlipidemia, unspecified: Secondary | ICD-10-CM

## 2014-12-18 LAB — LIPID PANEL
Cholesterol: 183 mg/dL (ref 0–200)
HDL: 67.7 mg/dL (ref >39.00)
LDL Cholesterol: 93 mg/dL (ref 0–99)
NONHDL: 115.7
TRIGLYCERIDES: 112 mg/dL (ref 0.0–149.0)
Total CHOL/HDL Ratio: 3
VLDL: 22.4 mg/dL (ref 0.0–40.0)

## 2014-12-18 LAB — GLUCOSE, POCT (MANUAL RESULT ENTRY): POC Glucose: 97 mg/dl (ref 70–99)

## 2014-12-18 LAB — HEMOGLOBIN A1C: HEMOGLOBIN A1C: 5.8 % (ref 4.6–6.5)

## 2015-04-07 ENCOUNTER — Other Ambulatory Visit: Payer: Self-pay

## 2015-04-07 DIAGNOSIS — Z1231 Encounter for screening mammogram for malignant neoplasm of breast: Secondary | ICD-10-CM

## 2015-04-21 ENCOUNTER — Telehealth: Payer: Self-pay | Admitting: Internal Medicine

## 2015-04-21 DIAGNOSIS — M8580 Other specified disorders of bone density and structure, unspecified site: Secondary | ICD-10-CM

## 2015-04-21 DIAGNOSIS — E559 Vitamin D deficiency, unspecified: Secondary | ICD-10-CM

## 2015-04-21 NOTE — Telephone Encounter (Signed)
Would you like to try generic Crestor?

## 2015-04-21 NOTE — Telephone Encounter (Signed)
aks her if she wants to try generic crestor 10 mg per day  or just wait and discuss at next ov?  Please send in dexa order  To  Facility of her choice   Dx estrogen deficient vit d deficient and osteopenia

## 2015-04-21 NOTE — Telephone Encounter (Signed)
Pt call to say she need a bone density test and is saying she need an order from Dr Fabian SharpPanosh.  Pt said she stopped her cholesterol med because it was giving her muscle aches.  Pt would like a call back   [phone]

## 2015-04-22 NOTE — Telephone Encounter (Signed)
Left a message for a return call.

## 2015-04-22 NOTE — Telephone Encounter (Signed)
Spoke to the pt.  [facility]  Order placed.  At this time she will wait and discuss new medication at her next office visit.

## 2015-04-23 ENCOUNTER — Ambulatory Visit: Payer: BLUE CROSS/BLUE SHIELD

## 2015-04-23 ENCOUNTER — Ambulatory Visit
Admission: RE | Admit: 2015-04-23 | Discharge: 2015-04-23 | Disposition: A | Discharge status: Home / Self Care | Payer: BLUE CROSS/BLUE SHIELD | Source: Ambulatory Visit

## 2015-04-23 DIAGNOSIS — Z1231 Encounter for screening mammogram for malignant neoplasm of breast: Secondary | ICD-10-CM

## 2015-04-25 ENCOUNTER — Ambulatory Visit
Admission: RE | Admit: 2015-04-25 | Discharge: 2015-04-25 | Disposition: A | Discharge status: Home / Self Care | Payer: BLUE CROSS/BLUE SHIELD | Source: Ambulatory Visit | Attending: Internal Medicine | Admitting: Internal Medicine

## 2015-04-25 DIAGNOSIS — E559 Vitamin D deficiency, unspecified: Secondary | ICD-10-CM

## 2015-04-25 DIAGNOSIS — M8580 Other specified disorders of bone density and structure, unspecified site: Secondary | ICD-10-CM

## 2015-05-01 NOTE — Progress Notes (Signed)
Pre visit review using our clinic review tool, if applicable. No additional management support is needed unless otherwise documented below in the visit note.  Chief Complaint  Patient presents with  . Follow-up    HPI: Samantha Adkins 60 y.o.  Comes in for fu number of issues has a list of concerns  Last visit   05/08/2014  . LIPIDS:  Began to have se after about 6 mos Stopped pravastatin felt  Better within  Days better after that  myalgias aches and  Memory issues   She has had se of other meds  ? zetia   Mom had  vasc disase In her 17s .  Had angioplasty .  Felt  ...   Had dexa  Mom had hip fx and rx  ROS: See pertinent positives and negatives per HPI. Had bad cough  In [place]    Fu nodule   From last years     Asks about ringing  in ears for years   ? Hearing taking 81 asa  No change would like more eval no dizziness or imbalance with his  Est Vit d 2000 iu per day .  Hx low level   Rare use of alprazolam 2 per year  trigger finger thubm left bothering her hagain  Gets numbness in hands at night ? Positional  No weakness  Asks about getting crp   Past Medical History  Diagnosis Date  . Hyperlipidemia   . Hx: UTI (urinary tract infection)   . Chicken pox     Family History  Problem Relation Age of Onset  . Hyperlipidemia Mother     age 66 passed 05/08/2014  . Heart disease Mother     stents in 68 s   . Hypertension Mother   . Hyperlipidemia Father   . Heart disease Father     rheumatic died 46 chf   . Cancer Brother     Lung deceased 05-08-10  . Pulmonary fibrosis Mother   . Atrial fibrillation Mother     Social History   Social History  . Marital Status: Married    Spouse Name: N/A  . Number of Children: N/A  . Years of Education: N/A   Social History Main Topics  . Smoking status: Never Smoker   . Smokeless tobacco: Never Used  . Alcohol Use: Yes     Comment: Drinks wine at dinner  . Drug Use: No  . Sexual Activity: Yes   Other Topics Concern  . Not on file     Social History Narrative   7-8 hours of sleep per night   2 people living in the home   No pets   Originally from [place] moved to [place] and in [place] back in [place]   Husbands job [employer]   bs degree   G3P3 kids out of house    Neg ets FA  up to 7 wine per week     Outpatient Prescriptions Prior to Visit  Medication Sig Dispense Refill  . aspirin 81 MG tablet Take 81 mg by mouth daily.    . Calcium Citrate (CITRACAL PO) Take by mouth.    . Cholecalciferol (VITAMIN D3) 1000 UNITS CAPS Take by mouth.    . Multiple Vitamins-Minerals (CENTRUM SILVER ADULT 50+ PO) Take by mouth.    . valACYclovir (VALTREX) 1000 MG tablet Take 2 tablets (2,000 mg total) by mouth 2 (two) times daily. For outbreak 30 tablet 1  . ALPRAZolam (XANAX) 0.25 MG  tablet Take 1 tablet (0.25 mg total) by mouth 2 (two) times daily as needed for anxiety. (Patient not taking: Reported on 05/02/2015) 20 tablet 0  . Coenzyme Q10 (CO Q 10 PO) Take by mouth.    . pravastatin (PRAVACHOL) 40 MG tablet TAKE 1 TABLET BY MOUTH EVERY DAY 90 tablet 3   No facility-administered medications prior to visit.     EXAM:  BP 116/82 mmHg  Temp(Src) 98.3 F (36.8 C) (Oral)  Wt 150 lb 14.4 oz (68.448 kg)  LMP 01/04/2010  Body mass index is 27.59 kg/(m^2).  GENERAL: vitals reviewed and listed above, alert, oriented, appears well hydrated and in no acute distress HEENT: atraumatic, conjunctiva  clear, no obvious abnormalities on inspection of external nose and ears OP : no lesion edema or exudate  NECK: no obvious masses on inspection palpation  LUNGS: clear to auscultation bilaterally, no wheezes, rales or rhonchi, good air movement CV: HRRR, no clubbing cyanosis or  peripheral edema nl cap refill  MS: moves all extremities without noticeable focal  Abnormality  Grip ok mild click left  Data reviewed    ASSESSMENT: The BMD measured at Femur Neck Right is 0.693 g/cm2 with a T-score of -2.5. This  patient is considered osteoporotic according to World Health Organization [facility]) criteria. L-3 was excluded due to degenerative change. Per the official positions of the ISCD, it is not possible to quantitatively compare BMD or calculate an [facility] between exams done at different facilities.  Site Region Measured Date Measured Age YA BMD Significant CHANGE T-score DualFemur Neck Right 04/25/2015 59.4 -2.5 0.693 g/cm2  AP Spine L1-L4 (L3) 04/25/2015 59.4 -1.1 1.043 g/cm2  World Health Organization [facility]) criteria for post-menopausal, Caucasian Women: Normal T-score at or above -1 SD Osteopenia T-score between -1 and -2.5 SD Osteoporosis T-score at or below -2.5 SD ASSESSMENT AND PLAN:  Discussed the following assessment and plan:  HLD (hyperlipidemia) - Plan: Basic metabolic panel, Hepatic function panel, Lipid panel, TSH, T4, free, VITAMIN D 25 Hydroxy (Vit-D Deficiency, Fractures), PTH, intact and calcium, Prolactin, CBC with Differential/Platelet, CRP, High Sensitivity, Hemoglobin A1c  Vitamin D deficiency - check level  - Plan: Basic metabolic panel, Hepatic function panel, Lipid panel, TSH, T4, free, VITAMIN D 25 Hydroxy (Vit-D Deficiency, Fractures), PTH, intact and calcium, Prolactin, CBC with Differential/Platelet, CRP, High Sensitivity, Hemoglobin A1c  Trigger thumb, left - recurring  with some arthritis  - Plan: Basic metabolic panel, Hepatic function panel, Lipid panel, TSH, T4, free, VITAMIN D 25 Hydroxy (Vit-D Deficiency, Fractures), PTH, intact and calcium, Prolactin, CBC with Differential/Platelet, CRP, High Sensitivity, Hemoglobin A1c  Medication management - Plan: Basic metabolic panel, Hepatic function panel, Lipid panel, TSH, T4, free, VITAMIN D 25 Hydroxy (Vit-D Deficiency, Fractures), PTH, intact and calcium, Prolactin, CBC with Differential/Platelet, CRP, High Sensitivity, Hemoglobin A1c  Ringing in ear, bilateral - prob need  audiogram  checked and will do ent referral - Plan: Basic metabolic panel, Hepatic function panel, Lipid panel, TSH, T4, free, VITAMIN D 25 Hydroxy (Vit-D Deficiency, Fractures), PTH, intact and calcium, Prolactin, CBC with Differential/Platelet, CRP, High Sensitivity, Hemoglobin A1c, Ambulatory referral to ENT  Fasting hyperglycemia - Plan: Basic metabolic panel, Hepatic function panel, Lipid panel, TSH, T4, free, VITAMIN D 25 Hydroxy (Vit-D Deficiency, Fractures), PTH, intact and calcium, Prolactin, CBC with Differential/Platelet, CRP, High Sensitivity, Hemoglobin A1c  Osteoporosis - by dexa x 1 r hip -2.5  disc pt will repeat at her preovious machine to check trends ,mom had fractures - Plan: Basic  metabolic panel, Hepatic function panel, Lipid panel, TSH, T4, free, VITAMIN D 25 Hydroxy (Vit-D Deficiency, Fractures), PTH, intact and calcium, Prolactin, CBC with Differential/Platelet, CRP, High Sensitivity, Hemoglobin A1c  Drug side effects, initial encounter - statin  myalgias nad mental fogginess - Plan: Basic metabolic panel, Hepatic function panel, Lipid panel, TSH, T4, free, VITAMIN D 25 Hydroxy (Vit-D Deficiency, Fractures), PTH, intact and calcium, Prolactin, CBC with Differential/Platelet, CRP, High Sensitivity, Hemoglobin A1c  Incidental pulmonary nodule - ct scan - Plan: CT Chest Wo Contrast, Basic metabolic panel, Hepatic function panel, Lipid panel, TSH, T4, free, VITAMIN D 25 Hydroxy (Vit-D Deficiency, Fractures), PTH, intact and calcium, Prolactin, CBC with Differential/Platelet, CRP, High Sensitivity, Hemoglobin A1c  Statin intolerance - Plan: Basic metabolic panel, Hepatic function panel, Lipid panel, TSH, T4, free, VITAMIN D 25 Hydroxy (Vit-D Deficiency, Fractures), PTH, intact and calcium, Prolactin, CBC with Differential/Platelet, CRP, High Sensitivity, Hemoglobin A1c  Paresthesia of both hands - nocturnal progressive  poss cts  - Plan: Basic metabolic panel, Hepatic function panel,  Lipid panel, TSH, T4, free, VITAMIN D 25 Hydroxy (Vit-D Deficiency, Fractures), PTH, intact and calcium, Prolactin, CBC with Differential/Platelet, CRP, High Sensitivity, Hemoglobin A1c Pt says cant take statin.   Risk may be more than benefit esp since  Had 0 cacs  In past .  Get lab in a few weeks and then fu  Consider endo consult about the osteoporosis early hip  Plan labs  -Patient advised to return or notify health care team  if symptoms worsen ,persist or new concerns arise.  Total visit 40mins > 50% spent counseling and coordinating care as indicated in above note and in instructions to patient .     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   For the trigger thumb and  Numbness  At night.  Ok to fu dexa   Scan  Comparison.   Consideration of  Seeing endocrinology for conult   Will arrange  Ct scan pulmonary  nodule . Due in JUne  You will be contacted   Referral  To ent for ringing in ears.   Make appt for fasting lab  In about a month  And i will put in orders to include chem vit d lipipds  pth etc    ROV  to discuss  Results if needed .    Neta MendsWanda K. [place] Haub M.D.

## 2015-05-02 ENCOUNTER — Ambulatory Visit (INDEPENDENT_AMBULATORY_CARE_PROVIDER_SITE_OTHER): Payer: BLUE CROSS/BLUE SHIELD | Admitting: Internal Medicine

## 2015-05-02 ENCOUNTER — Encounter: Payer: Self-pay | Admitting: Internal Medicine

## 2015-05-02 VITALS — BP 116/82 | Temp 98.3°F | Wt 150.9 lb

## 2015-05-02 DIAGNOSIS — H9319 Tinnitus, unspecified ear: Secondary | ICD-10-CM | POA: Insufficient documentation

## 2015-05-02 DIAGNOSIS — M65312 Trigger thumb, left thumb: Secondary | ICD-10-CM

## 2015-05-02 DIAGNOSIS — E559 Vitamin D deficiency, unspecified: Secondary | ICD-10-CM | POA: Diagnosis not present

## 2015-05-02 DIAGNOSIS — T887XXA Unspecified adverse effect of drug or medicament, initial encounter: Secondary | ICD-10-CM

## 2015-05-02 DIAGNOSIS — H9313 Tinnitus, bilateral: Secondary | ICD-10-CM

## 2015-05-02 DIAGNOSIS — Z79899 Other long term (current) drug therapy: Secondary | ICD-10-CM

## 2015-05-02 DIAGNOSIS — R7301 Impaired fasting glucose: Secondary | ICD-10-CM

## 2015-05-02 DIAGNOSIS — Z889 Allergy status to unspecified drugs, medicaments and biological substances status: Secondary | ICD-10-CM

## 2015-05-02 DIAGNOSIS — R202 Paresthesia of skin: Secondary | ICD-10-CM

## 2015-05-02 DIAGNOSIS — M810 Age-related osteoporosis without current pathological fracture: Secondary | ICD-10-CM | POA: Insufficient documentation

## 2015-05-02 DIAGNOSIS — Z789 Other specified health status: Secondary | ICD-10-CM | POA: Insufficient documentation

## 2015-05-02 DIAGNOSIS — E785 Hyperlipidemia, unspecified: Secondary | ICD-10-CM | POA: Diagnosis not present

## 2015-05-02 DIAGNOSIS — R911 Solitary pulmonary nodule: Secondary | ICD-10-CM

## 2015-05-02 DIAGNOSIS — T50905A Adverse effect of unspecified drugs, medicaments and biological substances, initial encounter: Secondary | ICD-10-CM

## 2015-05-02 NOTE — Patient Instructions (Signed)
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   For the trigger thumb and  Numbness  At night.  Ok to fu dexa   Scan  Comparison.   Consideration of  Seeing endocrinology for conult   Will arrange  Ct scan pulmonary  nodule . Due in JUne  You will be contacted   Referral  To ent for ringing in ears.   Make appt for fasting lab  In about a month  And i will put in orders to include chem vit d lipipds  pth etc    ROV  to discuss  Results if needed .

## 2015-05-19 ENCOUNTER — Telehealth: Payer: Self-pay | Admitting: Internal Medicine

## 2015-05-19 DIAGNOSIS — M810 Age-related osteoporosis without current pathological fracture: Secondary | ICD-10-CM

## 2015-05-19 NOTE — Telephone Encounter (Signed)
Pt called to ask about a referral to Endocrinology as per her discussion with Dr Fabian SharpPanosh would like a call back

## 2015-05-19 NOTE — Telephone Encounter (Signed)
Please refer dr Elvera LennoxGherghe or other endo  Dx  osteoporosis but dexa   fam hx  Of  fracture

## 2015-05-19 NOTE — Telephone Encounter (Signed)
Pt ask if you would give her a call. She said she cant get in to see the endocrinology till July

## 2015-05-19 NOTE — Telephone Encounter (Signed)
Referral placed in the system. 

## 2015-05-20 NOTE — Telephone Encounter (Signed)
Spoke to the pt.  Dr. Elvera LennoxGherghe can see her in July.  Would like to know if she should wait that long.  Another doctor can see her sooner that that.  She did not name the physician.  Would like to know if she can be seen by the other physician or wait to see Dr. Elvera LennoxGherghe.  Please advise.  Thanks!

## 2015-05-21 NOTE — Telephone Encounter (Addendum)
I think  July is fine    No harm   To her health to wait .  By waiting 2 months .  Also we will have lab results before you appt with her .

## 2015-05-21 NOTE — Telephone Encounter (Signed)
Spoke to the pt and advised ok to wait until July and will have lab work.  Pt agreed.

## 2015-05-22 ENCOUNTER — Ambulatory Visit
Admission: RE | Admit: 2015-05-22 | Discharge: 2015-05-22 | Disposition: A | Discharge status: Home / Self Care | Payer: BLUE CROSS/BLUE SHIELD | Source: Ambulatory Visit | Attending: Internal Medicine | Admitting: Internal Medicine

## 2015-05-22 DIAGNOSIS — R911 Solitary pulmonary nodule: Secondary | ICD-10-CM

## 2015-05-28 LAB — HM DEXA SCAN

## 2015-05-31 ENCOUNTER — Encounter: Payer: Self-pay | Admitting: Internal Medicine

## 2015-06-03 ENCOUNTER — Other Ambulatory Visit: Payer: BLUE CROSS/BLUE SHIELD

## 2015-06-13 ENCOUNTER — Other Ambulatory Visit (INDEPENDENT_AMBULATORY_CARE_PROVIDER_SITE_OTHER): Payer: BLUE CROSS/BLUE SHIELD

## 2015-06-13 ENCOUNTER — Encounter: Payer: Self-pay | Admitting: Family Medicine

## 2015-06-13 DIAGNOSIS — M65312 Trigger thumb, left thumb: Secondary | ICD-10-CM

## 2015-06-13 DIAGNOSIS — E785 Hyperlipidemia, unspecified: Secondary | ICD-10-CM | POA: Diagnosis not present

## 2015-06-13 DIAGNOSIS — T887XXA Unspecified adverse effect of drug or medicament, initial encounter: Secondary | ICD-10-CM

## 2015-06-13 DIAGNOSIS — H9313 Tinnitus, bilateral: Secondary | ICD-10-CM

## 2015-06-13 DIAGNOSIS — Z79899 Other long term (current) drug therapy: Secondary | ICD-10-CM

## 2015-06-13 DIAGNOSIS — M810 Age-related osteoporosis without current pathological fracture: Secondary | ICD-10-CM

## 2015-06-13 DIAGNOSIS — R911 Solitary pulmonary nodule: Secondary | ICD-10-CM

## 2015-06-13 DIAGNOSIS — R202 Paresthesia of skin: Secondary | ICD-10-CM

## 2015-06-13 DIAGNOSIS — R7301 Impaired fasting glucose: Secondary | ICD-10-CM

## 2015-06-13 DIAGNOSIS — E559 Vitamin D deficiency, unspecified: Secondary | ICD-10-CM

## 2015-06-13 DIAGNOSIS — Z889 Allergy status to unspecified drugs, medicaments and biological substances status: Secondary | ICD-10-CM

## 2015-06-13 DIAGNOSIS — Z789 Other specified health status: Secondary | ICD-10-CM

## 2015-06-13 DIAGNOSIS — T50905A Adverse effect of unspecified drugs, medicaments and biological substances, initial encounter: Secondary | ICD-10-CM

## 2015-06-13 LAB — CBC WITH DIFFERENTIAL/PLATELET
Basophils Absolute: 0 10*3/uL (ref 0.0–0.1)
Basophils Relative: 0.6 % (ref 0.0–3.0)
EOS PCT: 2.1 % (ref 0.0–5.0)
Eosinophils Absolute: 0.1 10*3/uL (ref 0.0–0.7)
HCT: 43.1 % (ref 36.0–46.0)
Hemoglobin: 14.4 g/dL (ref 12.0–15.0)
Lymphocytes Relative: 36.9 % (ref 12.0–46.0)
Lymphs Abs: 1.5 10*3/uL (ref 0.7–4.0)
MCHC: 33.4 g/dL (ref 30.0–36.0)
MCV: 90.5 fl (ref 78.0–100.0)
MONOS PCT: 9.1 % (ref 3.0–12.0)
Monocytes Absolute: 0.4 10*3/uL (ref 0.1–1.0)
NEUTROS ABS: 2.1 10*3/uL (ref 1.4–7.7)
NEUTROS PCT: 51.3 % (ref 43.0–77.0)
PLATELETS: 259 10*3/uL (ref 150.0–400.0)
RBC: 4.76 Mil/uL (ref 3.87–5.11)
RDW: 13.4 % (ref 11.5–15.5)
WBC: 4.2 10*3/uL (ref 4.0–10.5)

## 2015-06-13 LAB — HIGH SENSITIVITY CRP: CRP, High Sensitivity: 0.84 mg/L (ref 0.000–5.000)

## 2015-06-13 LAB — BASIC METABOLIC PANEL
BUN: 17 mg/dL (ref 6–23)
CHLORIDE: 104 meq/L (ref 96–112)
CO2: 28 meq/L (ref 19–32)
Calcium: 10 mg/dL (ref 8.4–10.5)
Creatinine, Ser: 0.96 mg/dL (ref 0.40–1.20)
GFR: 63.11 mL/min (ref >60.00)
Glucose, Bld: 93 mg/dL (ref 70–99)
Potassium: 4.7 mEq/L (ref 3.5–5.1)
Sodium: 141 mEq/L (ref 135–145)

## 2015-06-13 LAB — HEPATIC FUNCTION PANEL
ALBUMIN: 4.7 g/dL (ref 3.5–5.2)
ALT: 14 U/L (ref 0–35)
AST: 20 U/L (ref 0–37)
Alkaline Phosphatase: 70 U/L (ref 39–117)
BILIRUBIN DIRECT: 0.1 mg/dL (ref 0.0–0.3)
TOTAL PROTEIN: 7.9 g/dL (ref 6.0–8.3)
Total Bilirubin: 0.7 mg/dL (ref 0.2–1.2)

## 2015-06-13 LAB — LIPID PANEL
Cholesterol: 230 mg/dL — ABNORMAL HIGH (ref 0–200)
HDL: 55.6 mg/dL (ref >39.00)
LDL Cholesterol: 157 mg/dL — ABNORMAL HIGH (ref 0–99)
NonHDL: 174.55
TRIGLYCERIDES: 89 mg/dL (ref 0.0–149.0)
Total CHOL/HDL Ratio: 4
VLDL: 17.8 mg/dL (ref 0.0–40.0)

## 2015-06-13 LAB — HEMOGLOBIN A1C: Hgb A1c MFr Bld: 5.8 % (ref 4.6–6.5)

## 2015-06-13 LAB — T4, FREE: FREE T4: 0.94 ng/dL (ref 0.60–1.60)

## 2015-06-13 LAB — VITAMIN D 25 HYDROXY (VIT D DEFICIENCY, FRACTURES): VITD: 26.65 ng/mL — ABNORMAL LOW (ref 30.00–100.00)

## 2015-06-13 LAB — TSH: TSH: 1.66 u[IU]/mL (ref 0.35–4.50)

## 2015-06-14 LAB — PROLACTIN: PROLACTIN: 3.6 ng/mL

## 2015-06-16 ENCOUNTER — Ambulatory Visit: Payer: BLUE CROSS/BLUE SHIELD | Admitting: Internal Medicine

## 2015-06-16 LAB — PTH, INTACT AND CALCIUM
Calcium: 9.9 mg/dL (ref 8.4–10.5)
PTH: 32 pg/mL (ref 14–64)

## 2015-06-17 NOTE — Progress Notes (Signed)
Pre visit review using our clinic review tool, if applicable. No additional management support is needed unless otherwise documented below in the visit note.  Chief Complaint  Patient presents with  . Follow-up    HPI: Samantha Adkins 60 y.o.  Comes in for fu of labs related to low bone density  Osteoporosis   Had dexa scan here and in [place] ( same place of prev dexa to compare)  She is here for fu results w ?s   Taking calcium and vit d  = about 3000 per day.   Asks about b12 levels not strict vegetarian . Sis has b12 deficiency .  Neg fam hx of celiac  Daughter  Has IBS  Trying off  Statin  And feels better from ms issues  Wants to try lsi ROS: See pertinent positives and negatives per HPI.  Past Medical History  Diagnosis Date  . Hyperlipidemia   . Hx: UTI (urinary tract infection)   . Chicken pox     Family History  Problem Relation Age of Onset  . Hyperlipidemia Mother     age 23 passed May 25, 2014  . Heart disease Mother     stents in 28 s   . Hypertension Mother   . Hyperlipidemia Father   . Heart disease Father     rheumatic died 72 chf   . Cancer Brother     Lung deceased May 25, 2010  . Pulmonary fibrosis Mother   . Atrial fibrillation Mother     Social History   Social History  . Marital Status: Married    Spouse Name: N/A  . Number of Children: N/A  . Years of Education: N/A   Social History Main Topics  . Smoking status: Never Smoker   . Smokeless tobacco: Never Used  . Alcohol Use: Yes     Comment: Drinks wine at dinner  . Drug Use: No  . Sexual Activity: Yes   Other Topics Concern  . Not on file   Social History Narrative   7-8 hours of sleep per night   2 people living in the home   No pets   Originally from [place] moved to [place] and in [place] back in [place]   Husbands job [employer]   bs degree   G3P3 kids out of house    Neg ets FA  up to 7 wine per week     Outpatient Prescriptions Prior to Visit  Medication Sig  Dispense Refill  . aspirin 81 MG tablet Take 81 mg by mouth daily.    . Calcium Citrate (CITRACAL PO) Take by mouth.    . Cholecalciferol (VITAMIN D3) 1000 UNITS CAPS Take by mouth.    . Multiple Vitamins-Minerals (CENTRUM SILVER ADULT 50+ PO) Take by mouth.    . valACYclovir (VALTREX) 1000 MG tablet Take 2 tablets (2,000 mg total) by mouth 2 (two) times daily. For outbreak 30 tablet 1  . ALPRAZolam (XANAX) 0.25 MG tablet Take 1 tablet (0.25 mg total) by mouth 2 (two) times daily as needed for anxiety. (Patient not taking: Reported on 05/02/2015) 20 tablet 0   No facility-administered medications prior to visit.     EXAM:  BP 122/72 mmHg  Temp(Src) 98.3 F (36.8 C) (Oral)  Wt 149 lb 8 oz (67.813 kg)  LMP 01/04/2010  Body mass index is 27.34 kg/(m^2).  GENERAL: vitals reviewed and listed above, alert, oriented, appears well hydrated and in no acute distress PSYCH: pleasant and cooperative, no obvious depression or  anxiety Lab Results  Component Value Date   WBC 4.2 06/13/2015   HGB 14.4 06/13/2015   HCT 43.1 06/13/2015   PLT 259.0 06/13/2015   GLUCOSE 93 06/13/2015   CHOL 230* 06/13/2015   TRIG 89.0 06/13/2015   HDL 55.60 06/13/2015   LDLCALC 157* 06/13/2015   ALT 14 06/13/2015   AST 20 06/13/2015   NA 141 06/13/2015   K 4.7 06/13/2015   CL 104 06/13/2015   CREATININE 0.96 06/13/2015   BUN 17 06/13/2015   CO2 28 06/13/2015   TSH 1.66 06/13/2015   HGBA1C 5.8 06/13/2015  dexa  minn -2.8 rfn ls -2. Th -1.8  Change 10%prev studies frax 2.8 % and 22 %  Scan in to record  See other labs    Mild low vit d  ASSESSMENT AND PLAN:  Discussed the following assessment and plan:  Osteoporosis - young age fam hx no other causes consider  celiac check other per endo  Vitamin D deficiency - vit d3 300- 4000 per day until sees endo dr Reece AgarG  HLD (hyperlipidemia) - try lsi  and reassess  - Plan: Lipid panel  Fasting hyperglycemia - no dm  Spent time discussing calcium serum versus  oral supplements. Vitamin D.Causes of osteoporosis or secondary. Using a bone density to look at risk. Answer questions as best possible. Keep appointment with Dr. Reece AgarG endocrine. Can get repeat lipid panel in the fall on lifestyle intervention as discussed. Total visit 25mins > 50% spent counseling and coordinating care as indicated in above note and in instructions to patient .  -Patient advised to return or notify health care team  if symptoms worsen ,persist or new concerns arise.  Patient Instructions  Take equivalent 4000 iu Vit d per day .  For now .  The scan does confirm early levels of osteoporosis risk    In hip.  In future  Consider celiac panel repeat vit and other as per Dr Elvera LennoxGherghe.   Continue weight  Bearing exercise .   Check lipid panel in  September October    With Continue lifestyle intervention healthy eating and exercise .      Neta MendsWanda K. Mikeila Burgen M.D.

## 2015-06-18 ENCOUNTER — Ambulatory Visit (INDEPENDENT_AMBULATORY_CARE_PROVIDER_SITE_OTHER): Payer: BLUE CROSS/BLUE SHIELD | Admitting: Internal Medicine

## 2015-06-18 ENCOUNTER — Encounter: Payer: Self-pay | Admitting: Internal Medicine

## 2015-06-18 VITALS — BP 122/72 | Temp 98.3°F | Wt 149.5 lb

## 2015-06-18 DIAGNOSIS — E785 Hyperlipidemia, unspecified: Secondary | ICD-10-CM | POA: Diagnosis not present

## 2015-06-18 DIAGNOSIS — E559 Vitamin D deficiency, unspecified: Secondary | ICD-10-CM

## 2015-06-18 DIAGNOSIS — R7301 Impaired fasting glucose: Secondary | ICD-10-CM

## 2015-06-18 DIAGNOSIS — M810 Age-related osteoporosis without current pathological fracture: Secondary | ICD-10-CM | POA: Diagnosis not present

## 2015-06-18 NOTE — Patient Instructions (Addendum)
Take equivalent 4000 iu Vit d per day .  For now .  The scan does confirm early levels of osteoporosis risk    In hip.  In future  Consider celiac panel repeat vit and other as per Dr Elvera LennoxGherghe.   Continue weight  Bearing exercise .   Check lipid panel in  September October    With Continue lifestyle intervention healthy eating and exercise .

## 2015-06-25 ENCOUNTER — Encounter: Payer: BLUE CROSS/BLUE SHIELD | Admitting: Internal Medicine

## 2015-07-29 ENCOUNTER — Ambulatory Visit: Payer: BLUE CROSS/BLUE SHIELD | Admitting: Internal Medicine

## 2015-08-11 ENCOUNTER — Encounter: Payer: Self-pay | Admitting: Internal Medicine

## 2015-08-11 ENCOUNTER — Other Ambulatory Visit: Payer: Self-pay | Admitting: Internal Medicine

## 2015-08-11 ENCOUNTER — Ambulatory Visit (INDEPENDENT_AMBULATORY_CARE_PROVIDER_SITE_OTHER): Payer: BLUE CROSS/BLUE SHIELD | Admitting: Internal Medicine

## 2015-08-11 VITALS — BP 110/72 | HR 75 | Ht 62.0 in | Wt 145.0 lb

## 2015-08-11 DIAGNOSIS — M810 Age-related osteoporosis without current pathological fracture: Secondary | ICD-10-CM | POA: Diagnosis not present

## 2015-08-11 DIAGNOSIS — E559 Vitamin D deficiency, unspecified: Secondary | ICD-10-CM

## 2015-08-11 LAB — VITAMIN D 25 HYDROXY (VIT D DEFICIENCY, FRACTURES): VITD: 43.35 ng/mL (ref 30.00–100.00)

## 2015-08-11 NOTE — Progress Notes (Signed)
Patient ID: Samantha Adkins, female   DOB: 07/04/1955, 60 y.o.   MRN: [id number]    HPI  Samantha Adkins is a 60 y.o.-year-old female, referred by her PCP, Dr. Fabian SharpPanosh, for management of osteoporosis. She also has a h/o vitamin D deficiency.  Pt was dx with OP in 2017.  I reviewed pt's DEXA scans: Date L1-L4 T score FN T score VFA analysis  [place] [place] [place] [facility] L1-L4: -2.0  FN: -2.8  No vertebral fractures from T4 to L4 vertebrae   04/25/2015 ([facility]  L1-L4 (L3): -1.1  RFN: -2.5 LFN: -2.2    In 2014: she was reportedly osteopenic - no records available  She denies fractures or falls.  No dizziness/vertigo/orthostasis/poor vision.  She has not been on the OP treatments.  + h/o vitamin D insufficiency. Last vit D level was: Lab Results  Component Value Date   VD25OH 26.65 (L) 06/13/2015   VD25OH 49 03/12/2013  12/17/2011: VD25OH: 20 04/29/2010: VD 25OH: 31 03/09/2010: VD 25OH: 18  Pt is on vitamin D (2000-2400 units a day). She is on Calcium 600 mg daily.   No weight bearing exercises. She walks daily for 45 min - started this year.   She does not take high vitamin A doses.  Menopause was at 60 y/o. She had oophorectomy in 2014.   Pt does have a FH of osteoporosis in mother at 7770s - she had a hip fx.  No h/o hyper/hypocalcemia or hyperparathyroidism. No h/o kidney stones. Lab Results  Component Value Date   PTH 32 06/13/2015   CALCIUM 10.0 06/13/2015   CALCIUM 9.9 06/13/2015   CALCIUM 9.7 05/31/2014   CALCIUM 9.3 06/28/2013   CALCIUM 9.4 03/12/2013   No h/o thyrotoxicosis. Reviewed TSH recent levels:  Lab Results  Component Value Date   TSH 1.66 06/13/2015   TSH 1.98 05/31/2014   TSH 0.36 06/28/2013   TSH 1.33 03/12/2013   No h/o CKD. Last BUN/Cr: Lab Results  Component Value Date   BUN 17 06/13/2015   CREATININE 0.96 06/13/2015   ROS: Constitutional: + weight gain, no fatigue, + hot flushes, + poor  sleep Eyes: no blurry vision, no xerophthalmia ENT: no sore throat, no nodules palpated in throat, no dysphagia/odynophagia, no hoarseness Cardiovascular: no CP/SOB/palpitations/leg swelling Respiratory: no cough/SOB Gastrointestinal: no N/V/D/C Musculoskeletal: no muscle/joint aches Skin: no rashes Neurological: no tremors/numbness/tingling/dizziness Psychiatric: no depression/anxiety + low libido  Past Medical History:  Diagnosis Date  . Chicken pox   . Hx: UTI (urinary tract infection)   . Hyperlipidemia    Past Surgical History:  Procedure Laterality Date  . BILATERAL SALPINGOOPHORECTOMY  05/2012   Social History   Social History  . Marital status: Married    Spouse name: N/A  . Number of children: 3   Occupational History  . n/a   Social History Main Topics  . Smoking status: Never Smoker  . Smokeless tobacco: Never Used  . Alcohol use Yes     Comment: Drinks wine at dinner  . Drug use: No   Social History Narrative   7-8 hours of sleep per night   2 people living in the home   No pets   [place]   Husbands job [employer]   bs degree   G3P3 kids out of house    Neg ets FA  up to 7 wine per week  Current Outpatient Prescriptions on File Prior to Visit  Medication Sig Dispense Refill  . aspirin 81 MG tablet Take 81 mg by mouth daily.    . Calcium Citrate (CITRACAL PO) Take by mouth.    . Cholecalciferol (VITAMIN D3) 1000 UNITS CAPS Take by mouth.    . Multiple Vitamins-Minerals (CENTRUM SILVER ADULT 50+ PO) Take by mouth.    . ALPRAZolam (XANAX) 0.25 MG tablet Take 1 tablet (0.25 mg total) by mouth 2 (two) times daily as needed for anxiety. (Patient not taking: Reported on 05/02/2015) 20 tablet 0  . valACYclovir (VALTREX) 1000 MG tablet Take 2 tablets (2,000 mg total) by mouth 2 (two) times daily. For outbreak (Patient not taking: Reported on 08/11/2015) 30 tablet 1   No current  facility-administered medications on file prior to visit.    Allergies  Allergen Reactions  . Statins     Muscle aches and fatigue  lipitor and pravastatin 10  Only    Family History  Problem Relation Age of Onset  . Hyperlipidemia Mother     age 42 passed 05/14/2014  . Heart disease Mother     stents in 48 s   . Hypertension Mother   . Hyperlipidemia Father   . Heart disease Father     rheumatic died 41 chf   . Cancer Brother     Lung deceased May 14, 2010  . Pulmonary fibrosis Mother   . Atrial fibrillation Mother     PE: BP 110/72 (BP Location: Left Arm, Patient Position: Sitting)   Pulse 75   Ht  (1.575 m)   Wt 145 lb (65.8 kg)   LMP 01/04/2010   SpO2 96%   BMI 26.52 kg/m  Wt Readings from Last 3 Encounters:  08/11/15 145 lb (65.8 kg)  06/18/15 149 lb 8 oz (67.8 kg)  05/02/15 150 lb 14.4 oz (68.4 kg)   Constitutional: normal weight, in NAD. No kyphosis. Eyes: PERRLA, EOMI, no exophthalmos ENT: moist mucous membranes, no thyromegaly, no cervical lymphadenopathy Cardiovascular: RRR, No MRG Respiratory: CTA B Gastrointestinal: abdomen soft, NT, ND, BS+ Musculoskeletal: no deformities, strength intact in all 4 Skin: moist, warm, no rashes Neurological: no tremor with outstretched hands, DTR normal in all 4  Assessment: 1. Osteoporosis  Plan: 1. Osteoporosis - likely postmenopausal. I explained that some women lose bone mineral density fast after menopause, some more slowly, and some not at all. - Discussed about increased risk of fracture, depending on the T score, greatly increased when the T score is lower than -2.5, but it is actually a continuum and -2.5 should not be regarded as an absolute threshold. We reviewed her DEXA scan report together, and I explained that based on the T scores, she has an increased risk for fractures.  - we reviewed her dietary and supplemental calcium and vitamin D intake. I recommended to make sure she gets 1000-1200 mg of calcium daily  and I will check vit D today to see if she needs further supplementation - given her specific instructions about food sources for Calcium and Vitamin D - see pt instructions  - discussed fall precautions   - given handout from [facility] Re: weight bearing exercises - advised to do this every day or at least 5/7 days - we discussed about maintaining a good amount of protein in her diet. The recommended daily protein intake is ~0.8 g per kilogram per day. I advised her to try to aim for this amount, since a diet low in  proteins can exacerbate osteoporosis. Also, avoid smoking or >2 drinks of alcohol a day. - We discussed about the different medication classes, benefits and side effects (including atypical fractures and ONJ - no dental workup in progress or planned).  - I explained that, since she tells me that she often forgets medicines, I would not use oral bisphosphonates, so my first choice would be sq denosumab (Prolia) for 3-6 years, then zoledronic acid (iv Reclast) for 1-2 years. I would use Teriparatide as a last resort. Pt was given reading information about Prolia and Reclast , and I explained the mechanism of action and expected benefits. However, she is reticent to start medications for now. - I will check the following labs for now:  Vitamin d  Celiac panel  NTX - I would be more prone to start antiresorptives if her NTX were high, since this implies increased bone resorption - will check a new DEXA scan in one year regardless of whether we start medicines are not. - will see pt back in a year  - time spent with the patient: 1 hour, of which >50% was spent in obtaining information about her symptoms, reviewing her previous labs, evaluations, and treatments, counseling her about her condition (please see the discussed topics above), and developing a plan to further investigate it; she had a number of questions which I addressed.  Component     Latest Ref Rng &  Units 08/11/2015  VITD     30.00 - 100.00 ng/mL 43.35  Vitamin D is normal.  Component     Latest Ref Rng & Units 08/11/2015  IgA     81 - 463 mg/dL 161  Endomysial Screen     NEGATIVE NEGATIVE  Tissue Transglutaminase Ab, IgA     <4 U/mL 1  Deamidated Gliadin Abs, IgG     <20 Units 3  Gliadin IgA     <20 Units 11  Tissue Transglut Ab     <6 U/mL 1  Negative celiac panel. N-telopeptide is elevated, indicating high bone turnover, therefore, she would benefit from Prolia or Reclast.  Carlus Pavlov, MD PhD [facility] Endocr[facility]

## 2015-08-11 NOTE — Patient Instructions (Addendum)
Please stop [facility].  Please stop at the lab.  Please return in 1 year.  How Can I Prevent Falls? Men and women with osteoporosis need to take care not to fall down. Falls can break bones. Some reasons people fall are: Poor vision  Poor balance  Certain diseases that affect how you walk  Some types of medicine, such as sleeping pills.  Some tips to help prevent falls outdoors are: Use a cane or walker  Wear rubber-soled shoes so you don't slip  Walk on grass when sidewalks are slippery  In winter, put salt or kitty litter on icy sidewalks.  Some ways to help prevent falls indoors are: Keep rooms free of clutter, especially on floors  Use plastic or carpet runners on slippery floors  Wear low-heeled shoes that provide good support  Do not walk in socks, stockings, or slippers  Be sure carpets and area rugs have skid-proof backs or are tacked to the floor  Be sure stairs are well lit and have rails on both sides  Put grab bars on bathroom walls near tub, shower, and toilet  Use a rubber bath mat in the shower or tub  Keep a flashlight next to your bed  Use a sturdy step stool with a handrail and wide steps  Add more lights in rooms (and night lights) Buy a cordless phone to keep with you so that you don't have to rush to the phone       when it rings and so that you can call for help if you fall.   (adapted from http://www.niams.NightlifePreviews.se)  Dietary sources of calcium and vitamin D:  Calcium content (mg) - http://www.niams.MoviePins.co.za  Fortified oatmeal, 1 packet 350  Sardines, canned in oil, with edible bones, 3 oz. 324  Cheddar cheese, 1 oz. shredded 306  Milk, nonfat, 1 cup 302  Milkshake, 1 cup 300  Yogurt, plain, low-fat, 1 cup 300  Soybeans, cooked, 1 cup 261  Tofu, firm, with calcium,  cup 204  Orange juice, fortified with calcium, 6 oz. 200-260 (varies)  Salmon, canned, with edible bones, 3  oz. 181  Pudding, instant, made with 2% milk,  cup 153  Baked beans, 1 cup [place], 1% milk fat, 1 cup 138  Spaghetti, lasagna, 1 cup 125  Frozen yogurt, vanilla, soft-serve,  cup 103  Ready-to-eat cereal, fortified with calcium, 1 cup 100-1,000 (varies)  Cheese pizza, 1 slice 544  Fortified waffles, 2 100  Turnip greens, boiled,  cup 99  Broccoli, raw, 1 cup 90  Ice cream, vanilla,  cup 85  Soy or rice milk, fortified with calcium, 1 cup 80-500 (varies)   Vitamin D content (International Units, IU) - https://www.ars.usda.gov Cod liver oil, 1 tablespoon 1,360  Swordfish, cooked, 3 oz 566  Salmon (sockeye), cooked, 3 oz 447  Tuna fish, canned in water, drained, 3 oz 154  Orange juice fortified with vitamin D, 1 cup (check product labels, as amount of added vitamin D varies) 137  Milk, nonfat, reduced fat, and whole, vitamin D-fortified, 1 cup 115-124  Yogurt, fortified with 20% of the daily value for vitamin D, 6 oz 80  Margarine, fortified, 1 tablespoon 60  Sardines, canned in oil, drained, 2 sardines 46  Liver, beef, cooked, 3 oz 42  Egg, 1 large (vitamin D is found in yolk) 41  Ready-to-eat cereal, fortified with 10% of the daily value for vitamin D, 0.75-1 cup  40  Cheese, Swiss, 1 oz 6   Exercise  for Strong Bones (from [place]) There are two types of exercises that are important for building and maintaining bone density:  weight-bearing and muscle-strengthening exercises. Weight-bearing Exercises These exercises include activities that make you move against gravity while staying upright. Weight-bearing exercises can be high-impact or low-impact. High-impact weight-bearing exercises help build bones and keep them strong. If you have broken a bone due to osteoporosis or are at risk of breaking a bone, you may need to avoid high-impact exercises. If youre not sure, you should check with your healthcare provider. Examples of high-impact  weight-bearing exercises are:  Dancing  Doing high-impact aerobics  Hiking  Jogging/running  Jumping Rope  Stair climbing  Tennis Low-impact weight-bearing exercises can also help keep bones strong and are a safe alternative if you cannot do high-impact exercises. Examples of low-impact weight-bearing exercises are:  Using elliptical training machines  Doing low-impact aerobics  Using stair-step machines  Fast walking on a treadmill or outside Muscle-Strengthening Exercises These exercises include activities where you move your body, a weight or some other resistance against gravity. They are also known as resistance exercises and include:  Lifting weights  Using elastic exercise bands  Using weight machines  Lifting your own body weight  Functional movements, such as standing and rising up on your toes Yoga and Pilates can also improve strength, balance and flexibility. However, certain positions may not be safe for people with osteoporosis or those at increased risk of broken bones. For example, exercises that have you bend forward may increase the chance of breaking a bone in the spine. A physical therapist should be able to help you learn which exercises are safe and appropriate for you. Non-Impact Exercises Non-impact exercises can help you to improve balance, posture and how well you move in everyday activities. These exercises can also help to increase muscle strength and decrease the risk of falls and broken bones. Some of these exercises include:  Balance exercises that strengthen your legs and test your balance, such as Tai Chi, can decrease your risk of falls.  Posture exercises that improve your posture and reduce rounded or sloping shoulders can help you decrease the chance of breaking a bone, especially in the spine.  Functional exercises that improve how well you move can help you with everyday activities and decrease your chance of falling and breaking a  bone. For example, if you have trouble getting up from a chair or climbing stairs, you should do these activities as exercises. A physical therapist can teach you balance, posture and functional exercises. Starting a New Exercise Program If you havent exercised regularly for a while, check with your healthcare provider before beginning a new exercise program--particularly if you have health problems such as heart disease, diabetes or high blood pressure. If youre at high risk of breaking a bone, you should work with a physical therapist to develop a safe exercise program. Once you have your healthcare providers approval, start slowly. If youve already broken bones in the spine because of osteoporosis, be very careful to avoid activities that require reaching down, bending forward, rapid twisting motions, heavy lifting and those that increase your chance of a fall. As you get started, your muscles may feel sore for a day or two after you exercise. If soreness lasts longer, you may be working too hard and need to ease up. Exercises should be done in a pain-free range of motion. How Much Exercise Do You Need? Weight-bearing exercises 30 minutes on most days of  the week. Do a 30-minutesession or multiple sessions spread out throughout the day. The benefits to your bones are the same.   Muscle-strengthening exercises Two to three days per week. If you dont have much time for strengthening/resistance training, do small amounts at a time. You can do just one body part each day. For example do arms one day, legs the next and trunk the next. You can also spread these exercises out during your normal day.  Balance, posture and functional exercises Every day or as often as needed. You may want to focus on one area more than the others. If you have fallen or lose your balance, spend time doing balance exercises. If you are getting rounded shoulders, work more on posture exercises. If you have trouble climbing  stairs or getting up from the couch, do more functional exercises. You can also perform these exercises at one time or spread them during your day. Work with a phyiscal therapist to learn the right exercises for you.    Denosumab: Patient drug information (Up-to-date) Copyright 213-561-4961 [place] rights reserved.  Brand Names: U.S[place]  ProliaDelton See What is this drug used for?  It is used to treat soft, brittle bones (osteoporosis).  It is used for bone growth.  It is used when treating some cancers.  It may be given to you for other reasons. Talk with the doctor. What do I need to tell my doctor BEFORE I take this drug?  All products:  If you have an allergy to denosumab or any other part of this drug.  If you are allergic to any drugs like this one, any other drugs, foods, or other substances. Tell your doctor about the allergy and what signs you had, like rash; hives; itching; shortness of breath; wheezing; cough; swelling of face, lips, tongue, or throat; or any other signs.  If you have low calcium levels.  Prolia:  If you are pregnant or may be pregnant. Do not take this drug if you are pregnant.  This is not a list of all drugs or health problems that interact with this drug.  Tell your doctor and pharmacist about all of your drugs (prescription or OTC, natural products, vitamins) and health problems. You must check to make sure that it is safe for you to take this drug with all of your drugs and health problems. Do not start, stop, or change the dose of any drug without checking with your doctor. What are some things I need to know or do while I take this drug?  All products:  Tell dentists, surgeons, and other doctors that you use this drug.  This drug may raise the chance of a broken leg. Talk with your doctor.  Have your blood work checked. Talk with your doctor.  Have a bone density test. Talk with your doctor.  Take calcium and vitamin D as you were told  by your doctor.  Have a dental exam before starting this drug.  Take good care of your teeth. See a dentist often.  If you smoke, talk with your doctor.  Do not give to a child. Talk with your doctor.  Tell your doctor if you are breast-feeding. You will need to talk about any risks to your baby.  Delton See:  This drug may cause harm to the unborn baby if you take it while you are pregnant. If you get pregnant while taking this drug, call your doctor right away.  Prolia:  Very bad infections  have been reported with use of this drug. If you have any infection, are taking antibiotics now or in the recent past, or have many infections, talk with your doctor.  You may have more chance of getting an infection. Wash hands often. Stay away from people with infections, colds, or flu.  Use birth control that you can trust to prevent pregnancy while taking this drug.  If you are a man and your sex partner is pregnant or gets pregnant at any time while you are being treated, talk with your doctor. What are some side effects that I need to call my doctor about right away?  WARNING/CAUTION: Even though it may be rare, some people may have very bad and sometimes deadly side effects when taking a drug. Tell your doctor or get medical help right away if you have any of the following signs or symptoms that may be related to a very bad side effect:  All products:  Signs of an allergic reaction, like rash; hives; itching; red, swollen, blistered, or peeling skin with or without fever; wheezing; tightness in the chest or throat; trouble breathing or talking; unusual hoarseness; or swelling of the mouth, face, lips, tongue, or throat.  Signs of low calcium levels like muscle cramps or spasms, numbness and tingling, or seizures.  Mouth sores.  Any new or strange groin, hip, or thigh pain.  This drug may cause jawbone problems. The chance may be higher the longer you take this drug. The chance may be  higher if you have cancer, dental problems, dentures that do not fit well, anemia, blood clotting problems, or an infection. The chance may also be higher if you are having dental work or if you are getting chemo, some steroid drugs, or radiation. Call your doctor right away if you have jaw swelling or pain.  Xgeva:  Not hungry.  Muscle pain or weakness.  Seizures.  Shortness of breath.  Prolia:  Signs of infection. These include a fever of 100.41F (38C) or higher, chills, very bad sore throat, ear or sinus pain, cough, more sputum or change in color of sputum, pain with passing urine, mouth sores, wound that will not heal, or anal itching or pain.  Signs of a pancreas problem (pancreatitis) like very bad stomach pain, very bad back pain, or very bad upset stomach or throwing up.  Chest pain.  A heartbeat that does not feel normal.  Very bad skin irritation.  Feeling very tired or weak.  Bladder pain or pain when passing urine or change in how much urine is passed.  Passing urine often.  Swelling in the arms or legs. What are some other side effects of this drug?  All drugs may cause side effects. However, many people have no side effects or only have minor side effects. Call your doctor or get medical help if any of these side effects or any other side effects bother you or do not go away:  Xgeva:  Feeling tired or weak.  Headache.  Upset stomach or throwing up.  Loose stools (diarrhea).  Cough.  Prolia:  Back pain.  Muscle or joint pain.  Sore throat.  Runny nose.  Pain in arms or legs.  These are not all of the side effects that may occur. If you have questions about side effects, call your doctor. Call your doctor for medical advice about side effects.  You may report side effects to your national health agency. How is this drug best taken?  Use this  drug as ordered by your doctor. Read and follow the dosing on the label closely.  It is given as a  shot into the fatty part of the skin. What do I do if I miss a dose?  Call the doctor to find out what to do. How do I store and/or throw out this drug?  This drug will be given to you in a hospital or doctor's office. You will not store it at home.  Keep all drugs out of the reach of children and pets.  Check with your pharmacist about how to throw out unused drugs.  General drug facts  If your symptoms or health problems do not get better or if they become worse, call your doctor.  Do not share your drugs with others and do not take anyone else's drugs.  Keep a list of all your drugs (prescription, natural products, vitamins, OTC) with you. Give this list to your doctor.  Talk with the doctor before starting any new drug, including prescription or OTC, natural products, or vitamins.  Some drugs may have another patient information leaflet. If you have any questions about this drug, please talk with your doctor, pharmacist, or other health care provider.  If you think there has been an overdose, call your poison control center or get medical care right away. Be ready to tell or show what was taken, how much, and when it happened.  Zoledronic acid: Patient drug information (Up-to-Date) Copyright [phone] [place] rights reserved.  Brand Names: [place]  Reclast;  Zometa What is this drug used for?  It is used to treat high calcium levels.  It is used when treating some cancers.  It is used to treat Paget's disease.  It is used to put off or treat soft, brittle bones (osteoporosis).  It may be given to you for other reasons. Talk with the doctor. What do I need to tell my doctor BEFORE I take this drug?  All products:  If you have an allergy to zoledronic acid or any other part of this drug.  If you are allergic to any drugs like this one, any other drugs, foods, or other substances. Tell your doctor about the allergy and what signs you had, like rash; hives;  itching; shortness of breath; wheezing; cough; swelling of face, lips, tongue, or throat; or any other signs.  Reclast:  If you have low calcium levels.  If you have very bad kidney disease.  This is not a list of all drugs or health problems that interact with this drug.  Tell your doctor and pharmacist about all of your drugs (prescription or OTC, natural products, vitamins) and health problems. You must check to make sure that it is safe for you to take this drug with all of your drugs and health problems. Do not start, stop, or change the dose of any drug without checking with your doctor. What are some things I need to know or do while I take this drug?  All products:  Tell dentists, surgeons, and other doctors that you use this drug.  Worsening of asthma has happened in people taking drugs like this one. Talk with your doctor.  This drug may raise the chance of a broken leg. Talk with your doctor.  Have your blood work checked often. Talk with your doctor.  Have a bone density test. Talk with your doctor.  Have a dental exam before starting this drug.  Take good care of your teeth. See a  dentist often.  Do not give to a child. Talk with your doctor.  If you are 25 or older, use this drug with care. You could have more side effects.  This drug may cause harm to the unborn baby if you take it while you are pregnant.  Tell your doctor if you are pregnant or plan on getting pregnant. You will need to talk about the benefits and risks of using this drug while you are pregnant.  Tell your doctor if you are breast-feeding. You will need to talk about any risks to your baby.  Zometa:  Take calcium and vitamin D as you were told by your doctor.  Reclast:  This drug works best when used with calcium/vitamin D and weight-bearing workouts like walking or PT (physical therapy).  Follow the diet and workout plan that your doctor told you about.  Use birth control that you can  trust to prevent pregnancy while taking this drug. What are some side effects that I need to call my doctor about right away?  WARNING/CAUTION: Even though it may be rare, some people may have very bad and sometimes deadly side effects when taking a drug. Tell your doctor or get medical help right away if you have any of the following signs or symptoms that may be related to a very bad side effect:  Signs of an allergic reaction, like rash; hives; itching; red, swollen, blistered, or peeling skin with or without fever; wheezing; tightness in the chest or throat; trouble breathing or talking; unusual hoarseness; or swelling of the mouth, face, lips, tongue, or throat.  Signs of low calcium levels like muscle cramps or spasms, numbness and tingling, or seizures.  Signs of kidney problems like unable to pass urine, change in the amount of urine passed, blood in the urine, or a big weight gain.  Very bad bone, joint, or muscle pain.  Any new or strange groin, hip, or thigh pain.  Chest pain.  A heartbeat that does not feel normal.  Slow heartbeat.  Change in eyesight.  Eye pain.  Mouth sores.  Trouble swallowing.  Very bad pain when swallowing.  Any bruising or bleeding.  Pain where the shot was given.  Redness or swelling where the shot is given.  This drug may cause jawbone problems. The chance may be higher the longer you take this drug. The chance may be higher if you have cancer, dental problems, dentures that do not fit well, anemia, blood clotting problems, or an infection. The chance may also be higher if you are having dental work or if you are getting chemo, some steroid drugs, or radiation. Call your doctor right away if you have jaw swelling or pain. What are some other side effects of this drug?  All drugs may cause side effects. However, many people have no side effects or only have minor side effects. Call your doctor or get medical help if any of these side effects  or any other side effects bother you or do not go away:  All products:  Dizziness.  Upset stomach or throwing up.  Irritation where the shot is given.  Feeling tired or weak.  Belly pain.  Headache.  Flu-like signs.  Loose stools (diarrhea).  Muscle or joint pain.  Back pain.  Zometa:  Not able to sleep.  Not hungry.  Hard stools (constipation).  Weight loss.  Cough.  These are not all of the side effects that may occur. If you have questions about side  effects, call your doctor. Call your doctor for medical advice about side effects.  You may report side effects to your national health agency. How is this drug best taken?  Use this drug as ordered by your doctor. Read and follow the dosing on the label closely.  All products:  It is given as a shot into a vein over a period of time.  Drink lots of noncaffeine liquids unless told to drink less liquid by your doctor.  Reclast:  Acetaminophen may be given to lower fever and chills.  Drink at least 2 glasses of liquids a few hours before you get this drug. What do I do if I miss a dose?  Call the doctor to find out what to do. How do I store and/or throw out this drug?  This drug will be given to you in a hospital or doctor's office. You will not store it at home.  Keep all drugs out of the reach of children and pets.  Check with your pharmacist about how to throw out unused drugs.  General drug facts  If your symptoms or health problems do not get better or if they become worse, call your doctor.  Do not share your drugs with others and do not take anyone else's drugs.  Keep a list of all your drugs (prescription, natural products, vitamins, OTC) with you. Give this list to your doctor.  Talk with the doctor before starting any new drug, including prescription or OTC, natural products, or vitamins.  Some drugs may have another patient information leaflet. If you have any questions about this drug,  please talk with your doctor, pharmacist, or other health care provider.  If you think there has been an overdose, call your poison control center or get medical care right away. Be ready to tell or show what was taken, how much, and when it happened.

## 2015-08-12 LAB — CELIAC PANEL 10
ENDOMYSIAL SCREEN: NEGATIVE
GLIADIN IGA: 11 U (ref <20)
Gliadin IgG: 3 Units (ref <20)
IGA: 438 mg/dL (ref 81–463)
Tissue Transglut Ab: 1 U/mL (ref <6)
Tissue Transglutaminase Ab, IgA: 1 U/mL (ref <4)

## 2015-08-19 ENCOUNTER — Telehealth: Payer: Self-pay | Admitting: Internal Medicine

## 2015-08-19 ENCOUNTER — Telehealth: Payer: Self-pay

## 2015-08-19 NOTE — Telephone Encounter (Signed)
Received lab results faxed over from [facility] for the Celiac panel, will give to Dr.Gherghe for her to look over. Patient advised that other lab work was not ready yet, but will be back by the end of this week. Patient requested the hard copy fax of the lab work to be mailed to her since it was not on mychart.

## 2015-08-19 NOTE — Telephone Encounter (Signed)
Patient is calling about her lab results.she also has questions Please  Advise

## 2015-08-19 NOTE — Telephone Encounter (Signed)
anticipated lab results to be wrong,  Put in order during visit; added two test at the end. Other two test wasn't in the system, waited 40 minutes for the codes.  Patient wondering why it has taken this long to get labs. Patient states that she as suppose to get a cpeptide lab drawn, which I do not see in the orders. I will ask lab tech, and MD which labs they wanted, and I will call patient back.

## 2015-08-19 NOTE — Telephone Encounter (Signed)
Called and left message for patient to return phone call. Advised patient we did not have any other labs in at this time, other than Vitamin D. Advised patient to call back with other questions, gave call back number.

## 2015-08-20 LAB — N-TELOPEPTIDE, CROSS-LINKED, SERUM: N-Telopeptide: 17.3 nmol{BCE} (ref 6.2–19.0)

## 2015-08-28 ENCOUNTER — Telehealth: Payer: Self-pay

## 2015-08-28 NOTE — Telephone Encounter (Signed)
1. Menopause 2. Yes, I have not reviewed the images yet but I will 3. She should stay on the current dose of vitamin D. Vitamin K and magnesium can help, but I cannot recommend dosages, because they are not considered standard therapy for osteoporosis 4. Please discuss with PCP about checking her for vitamin B deficiency if there are suggestive symptoms 5. Physical therapy for osteoporosis would be a good idea. This is not part of the bowel her though. Some gyms offer this. 6. I do not know enough about osteogenic loading machines to advise her about this.

## 2015-08-28 NOTE — Telephone Encounter (Signed)
Returning patient call about questions:  Test shows high bone turnover patient wondering what is causing this to happen??   Had bone density test done and asked them to send over photos, CD and paper 2014/2017. Received??   Talked about Vitamin D; how many mg's should she take?? 2000 units or 4000 units.  Vitamin K and Mag should she up dosage for those?  Vitamin B deficiency hasn't been checked but wonders if she should take that supplement as well?   Physical Therapy for Osteoporosis, would this be an option for her as part of [place]??   Wondering about Osteogenic loading- machine, Place called: [facility] opened up, if you think this would be a good idea.    Has issues with the Prolia injections and would rather not have to go that route.

## 2015-08-28 NOTE — Telephone Encounter (Signed)
Patient would like to talk to the nurse, she has several questions.

## 2015-08-29 ENCOUNTER — Telehealth: Payer: Self-pay

## 2015-08-29 NOTE — Telephone Encounter (Signed)
Called and left message for patient and advised that we had received the pictures from the bone density that patient had completed, Dr.Gherghe just has not looked over them yet, but we will contact her if anything needed to be done.

## 2015-08-29 NOTE — Telephone Encounter (Signed)
Called patient and advised of vitamin D amount to take until she could see patient in office and discuss.

## 2015-08-29 NOTE — Telephone Encounter (Signed)
Patient ask if Dr Elvera LennoxGherghe received the pictures of the bone density scan from [place]. Please advise

## 2015-08-29 NOTE — Telephone Encounter (Signed)
Called patient back and patient still has questions, and will see her OBGYN and will call back to make appointment to see you as she continues to question things.   Patient states that you had discussed stopping calcium, and that has vitamin D in it, and wants to know how much she should take. She was still not clear on this issue. I tried to explain to take what she was before, patient continued to questions issues.   Advised patient to strongly make an appointment for questions so you could discuss these issues with her in person. Patient stated she would.

## 2015-08-29 NOTE — Telephone Encounter (Signed)
OK. She told me at last visit she was taking 2000-24000 units of vitamin D per day from supplements >> she needs to continue to take this amount.

## 2015-09-01 ENCOUNTER — Telehealth: Payer: Self-pay

## 2015-09-01 NOTE — Telephone Encounter (Signed)
Called and notified patient that she is able to come pick up the DVD from her hospital from Dr.Gherghe. I called and left a message that I had left it up front with her name on it, and she could come before her OB appointment.

## 2015-09-16 ENCOUNTER — Encounter: Payer: Self-pay | Admitting: Internal Medicine

## 2015-10-27 ENCOUNTER — Encounter: Payer: Self-pay | Admitting: Internal Medicine

## 2015-10-28 NOTE — Telephone Encounter (Signed)
Can add the vitamin d to labs    Under dx  Vit d deficiency  And osteoporosis   INform  her no guaranteed that  insurance will cover  Cost.

## 2015-10-29 ENCOUNTER — Other Ambulatory Visit: Payer: Self-pay | Admitting: Family Medicine

## 2015-10-29 DIAGNOSIS — M810 Age-related osteoporosis without current pathological fracture: Secondary | ICD-10-CM

## 2015-10-29 DIAGNOSIS — E559 Vitamin D deficiency, unspecified: Secondary | ICD-10-CM

## 2015-10-29 DIAGNOSIS — E785 Hyperlipidemia, unspecified: Secondary | ICD-10-CM

## 2015-11-01 ENCOUNTER — Encounter: Payer: Self-pay | Admitting: Internal Medicine

## 2015-11-06 ENCOUNTER — Other Ambulatory Visit (INDEPENDENT_AMBULATORY_CARE_PROVIDER_SITE_OTHER): Payer: BLUE CROSS/BLUE SHIELD

## 2015-11-06 DIAGNOSIS — E785 Hyperlipidemia, unspecified: Secondary | ICD-10-CM

## 2015-11-06 LAB — LIPID PANEL
CHOLESTEROL: 236 mg/dL — AB (ref 0–200)
HDL: 63.9 mg/dL (ref >39.00)
LDL Cholesterol: 153 mg/dL — ABNORMAL HIGH (ref 0–99)
NONHDL: 172.15
Total CHOL/HDL Ratio: 4
Triglycerides: 94 mg/dL (ref 0.0–149.0)
VLDL: 18.8 mg/dL (ref 0.0–40.0)

## 2015-11-06 LAB — VITAMIN D 25 HYDROXY (VIT D DEFICIENCY, FRACTURES): VITD: 45.53 ng/mL (ref 30.00–100.00)

## 2015-11-14 ENCOUNTER — Encounter: Payer: Self-pay | Admitting: *Deleted

## 2015-11-14 ENCOUNTER — Telehealth: Payer: Self-pay | Admitting: Family Medicine

## 2015-11-14 NOTE — Telephone Encounter (Signed)
Informed pt of results.  She would like to re check lipids in 3 months.  Is trying to lose weight.  Please advise.

## 2015-11-14 NOTE — Telephone Encounter (Signed)
Ok to repeat lipid panel in 3-4 months.

## 2015-11-14 NOTE — Telephone Encounter (Signed)
Message sent to patient via MyChart relaying message to call to make appointment with lab

## 2016-04-15 ENCOUNTER — Other Ambulatory Visit: Payer: Self-pay | Admitting: Internal Medicine

## 2016-04-15 ENCOUNTER — Telehealth: Payer: Self-pay | Admitting: Internal Medicine

## 2016-04-15 DIAGNOSIS — M810 Age-related osteoporosis without current pathological fracture: Secondary | ICD-10-CM

## 2016-04-15 NOTE — Telephone Encounter (Signed)
° ° °  Pt call to say she is moving and would like to have lipid panel,bmp, vitamin b & d. and a bone density done before she moves on  April 25th  If this is ok can orders be placed .

## 2016-04-16 NOTE — Telephone Encounter (Signed)
Dr. Panosh - Please advise. Thanks! 

## 2016-04-16 NOTE — Telephone Encounter (Signed)
Make  OV and can do lab at that visit   Ok to Can work her in before she moves.   Her last dexa was in 2017  So  Too early to get another    Based on  Record    Ask her about this

## 2016-04-16 NOTE — Telephone Encounter (Signed)
Spoke with pt and advised that Dr. Fabian Sharp recommends OV to discuss. Pt states that she is moving 4/24 and would like to have labs and bone density done before then. She is aware that insurance may not cover scan and is willing to pay out of pocket. Appt scheduled 04/19/16, pt aware.  Spoke with Dr. Fabian Sharp and she is fine to order bone density, order placed. Pt aware.  Nothing further needed at this time.

## 2016-04-19 ENCOUNTER — Encounter: Payer: Self-pay | Admitting: Internal Medicine

## 2016-04-19 ENCOUNTER — Ambulatory Visit (INDEPENDENT_AMBULATORY_CARE_PROVIDER_SITE_OTHER): Payer: BLUE CROSS/BLUE SHIELD | Admitting: Internal Medicine

## 2016-04-19 VITALS — BP 104/80 | HR 73 | Temp 98.3°F | Ht 62.0 in | Wt 142.8 lb

## 2016-04-19 DIAGNOSIS — E559 Vitamin D deficiency, unspecified: Secondary | ICD-10-CM

## 2016-04-19 DIAGNOSIS — Z79899 Other long term (current) drug therapy: Secondary | ICD-10-CM | POA: Diagnosis not present

## 2016-04-19 DIAGNOSIS — M810 Age-related osteoporosis without current pathological fracture: Secondary | ICD-10-CM

## 2016-04-19 DIAGNOSIS — E785 Hyperlipidemia, unspecified: Secondary | ICD-10-CM | POA: Diagnosis not present

## 2016-04-19 DIAGNOSIS — R7301 Impaired fasting glucose: Secondary | ICD-10-CM

## 2016-04-19 LAB — CBC WITH DIFFERENTIAL/PLATELET
BASOS ABS: 0 10*3/uL (ref 0.0–0.1)
Basophils Relative: 0.6 % (ref 0.0–3.0)
EOS ABS: 0.1 10*3/uL (ref 0.0–0.7)
Eosinophils Relative: 1.3 % (ref 0.0–5.0)
HCT: 42.5 % (ref 36.0–46.0)
HEMOGLOBIN: 14.4 g/dL (ref 12.0–15.0)
Lymphocytes Relative: 29.7 % (ref 12.0–46.0)
Lymphs Abs: 1.4 10*3/uL (ref 0.7–4.0)
MCHC: 33.9 g/dL (ref 30.0–36.0)
MCV: 91.6 fl (ref 78.0–100.0)
MONO ABS: 0.4 10*3/uL (ref 0.1–1.0)
Monocytes Relative: 7.3 % (ref 3.0–12.0)
Neutro Abs: 2.9 10*3/uL (ref 1.4–7.7)
Neutrophils Relative %: 61.1 % (ref 43.0–77.0)
Platelets: 260 10*3/uL (ref 150.0–400.0)
RBC: 4.64 Mil/uL (ref 3.87–5.11)
RDW: 13.5 % (ref 11.5–15.5)
WBC: 4.8 10*3/uL (ref 4.0–10.5)

## 2016-04-19 LAB — LIPID PANEL
CHOLESTEROL: 249 mg/dL — AB (ref 0–200)
HDL: 61.9 mg/dL (ref >39.00)
LDL Cholesterol: 170 mg/dL — ABNORMAL HIGH (ref 0–99)
NonHDL: 187.34
Total CHOL/HDL Ratio: 4
Triglycerides: 86 mg/dL (ref 0.0–149.0)
VLDL: 17.2 mg/dL (ref 0.0–40.0)

## 2016-04-19 LAB — BASIC METABOLIC PANEL
BUN: 13 mg/dL (ref 6–23)
CO2: 27 mEq/L (ref 19–32)
Calcium: 9.6 mg/dL (ref 8.4–10.5)
Chloride: 105 mEq/L (ref 96–112)
Creatinine, Ser: 0.86 mg/dL (ref 0.40–1.20)
GFR: 71.44 mL/min (ref >60.00)
Glucose, Bld: 88 mg/dL (ref 70–99)
POTASSIUM: 4.4 meq/L (ref 3.5–5.1)
Sodium: 142 mEq/L (ref 135–145)

## 2016-04-19 LAB — HEMOGLOBIN A1C: HEMOGLOBIN A1C: 5.8 % (ref 4.6–6.5)

## 2016-04-19 LAB — VITAMIN B12: Vitamin B-12: 262 pg/mL (ref 211–911)

## 2016-04-19 LAB — VITAMIN D 25 HYDROXY (VIT D DEFICIENCY, FRACTURES): VITD: 53.94 ng/mL (ref 30.00–100.00)

## 2016-04-19 NOTE — Patient Instructions (Signed)
Will notify you  of labs when available.   dexa scan is early.... And may not be accurate for reasons discuss .    Consider    [employer]  Specialist with concerns in future for other opinions.     kee up weight bearing exrecises.

## 2016-04-19 NOTE — Progress Notes (Signed)
No chief complaint on file.   HPI: Samantha Adkins 61 y.o. come in for Chronic disease management carries a diagnosis of osteoporosis and is moving. Request a bone density although her last one was done a year ago. She wants to have a DEXA on the same machine before she moves because she hasn't set up her medical home yet. She doesn't really want to take medicine if not needed she is using the ostial strong program where there is a resistance training. Taking vitamin D equivalent 300 minute's 3000 international units. She asked for a B vitamin checked because her sister has a low B vitamin and is on shots now her mom also ended up on shots. Her sister also has had a heart attack in the stent. There is a strong family history of osteoporosis and sister and mother. She has questions about a number of supplements. Please review chart in regards to pulmonary nodule follow-up CT. She would like to repeat her cholesterol level states that she is statin intolerant.   ROS: See pertinent positives and negatives per HPI.  Past Medical History:  Diagnosis Date  . Chicken pox   . Hx: UTI (urinary tract infection)   . Hyperlipidemia   . Osteoporosis     Family History  Problem Relation Age of Onset  . Hyperlipidemia Mother     age 26 passed May 07, 2014  . Heart disease Mother     stents in 39 s   . Hypertension Mother   . Pulmonary fibrosis Mother   . Atrial fibrillation Mother   . Hyperlipidemia Father   . Heart disease Father     rheumatic died 7 chf   . Peripheral Artery Disease Sister   . Hypertension Sister   . Osteoporosis Sister   . Cancer Brother     Lung deceased 05-07-10    Social History   Social History  . Marital status: Married    Spouse name: N/A  . Number of children: N/A  . Years of education: N/A   Social History Main Topics  . Smoking status: Never Smoker  . Smokeless tobacco: Never Used  . Alcohol use Yes     Comment: Drinks wine at dinner  . Drug use: No  .  Sexual activity: Yes   Other Topics Concern  . None   Social History Narrative   7-8 hours of sleep per night   2 people living in the home   No pets   Originally from [place] moved to [place] and in [place] back in [place]   Husbands job [employer]   bs degree   G3P3 kids out of house    Neg ets FA  up to 7 wine per week     Outpatient Medications Prior to Visit  Medication Sig Dispense Refill  . aspirin 81 MG tablet Take 81 mg by mouth daily.    . Cholecalciferol (VITAMIN D3) 1000 UNITS CAPS Take by mouth.    Andrey Campanile VAGINAL 0.1 MG/GM vaginal cream     . Multiple Vitamins-Minerals (CENTRUM SILVER ADULT 50+ PO) Take by mouth.    . Calcium Citrate (CITRACAL PO) Take by mouth.    . valACYclovir (VALTREX) 1000 MG tablet Take 2 tablets (2,000 mg total) by mouth 2 (two) times daily. For outbreak (Patient not taking: Reported on 04/19/2016) 30 tablet 1  . ALPRAZolam (XANAX) 0.25 MG tablet Take 1 tablet (0.25 mg total) by mouth 2 (two) times daily as needed for anxiety. (Patient  not taking: Reported on 05/02/2015) 20 tablet 0   No facility-administered medications prior to visit.      EXAM:  BP 104/80 (BP Location: Right Arm, Patient Position: Sitting, Cuff Size: Normal)   Pulse 73   Temp 98.3 F (36.8 C) (Oral)   Ht  (1.575 m)   Wt 142 lb 12.8 oz (64.8 kg)   LMP 01/04/2010   BMI 26.12 kg/m   Body mass index is 26.12 kg/m.  GENERAL: vitals reviewed and listed above, alert, oriented, appears well hydrated and in no acute distress PSYCH: pleasant and cooperative, no obvious depression  Mild anxiety about her osteoporosis condition  Lab Results  Component Value Date   WBC 4.8 04/19/2016   HGB 14.4 04/19/2016   HCT 42.5 04/19/2016   PLT 260.0 04/19/2016   GLUCOSE 88 04/19/2016   CHOL 249 (H) 04/19/2016   TRIG 86.0 04/19/2016   HDL 61.90 04/19/2016   LDLCALC 170 (H) 04/19/2016   ALT 14 06/13/2015   AST 20 06/13/2015   NA 142 04/19/2016   K 4.4  04/19/2016   CL 105 04/19/2016   CREATININE 0.86 04/19/2016   BUN 13 04/19/2016   CO2 27 04/19/2016   TSH 1.66 06/13/2015   HGBA1C 5.8 04/19/2016   BP Readings from Last 3 Encounters:  04/19/16 104/80  08/11/15 110/72  06/18/15 122/72    ASSESSMENT AND PLAN:  Discussed the following assessment and plan:  Osteoporosis, unspecified osteoporosis type, unspecified pathological fracture presence - Plan: Basic metabolic panel, CBC with Differential/Platelet, Hemoglobin A1c, Lipid panel, VITAMIN D 25 Hydroxy (Vit-D Deficiency, Fractures), Vitamin B12  Medication management - Plan: Basic metabolic panel, CBC with Differential/Platelet, Hemoglobin A1c, Lipid panel, VITAMIN D 25 Hydroxy (Vit-D Deficiency, Fractures), Vitamin B12  Hyperlipidemia, unspecified hyperlipidemia type - Plan: Basic metabolic panel, CBC with Differential/Platelet, Hemoglobin A1c, Lipid panel, VITAMIN D 25 Hydroxy (Vit-D Deficiency, Fractures), Vitamin B12  Vitamin D deficiency - Plan: Basic metabolic panel, CBC with Differential/Platelet, Hemoglobin A1c, Lipid panel, VITAMIN D 25 Hydroxy (Vit-D Deficiency, Fractures), Vitamin B12  Fasting hyperglycemia - Plan: Basic metabolic panel, CBC with Differential/Platelet, Hemoglobin A1c, Lipid panel, VITAMIN D 25 Hydroxy (Vit-D Deficiency, Fractures), Vitamin B12 Counseling session regarding the risk benefit of medications supplements evidence behind prevention. That the bone density in less than 2 years with progression of the mean may not be as accurate as she would hope. She was aware. In addition vitamin levels aren't that helpful. We did discuss other things that would be helpful for bone health. She is very worried about B vitamins because of the family history I don't think there is really no indication to get this level however she states she would pay for the level and we discussed adequate vitamin B-12 in her diet. She has no history of celiac or bowel. And no neurologic  signs. She will be moving in another week to 10 days can get these labs done today She did consider [facility] and bone health for advice which after she moves. Total visit > 50% spent counseling and coordinating care as indicated in above note and in instructions to patient .     -Patient advised to return or notify health care team  if  new concerns arise.  Patient Instructions  Will notify you  of labs when available.   dexa scan is early.... And may not be accurate for reasons discuss .    Consider  Consult   [facility]  Special[facility] with concerns in future for other opinions.     kee up weight bearing exrecises.     Neta Mends. Adiana Smelcer M.D.

## 2016-04-20 ENCOUNTER — Ambulatory Visit
Admission: RE | Admit: 2016-04-20 | Discharge: 2016-04-20 | Disposition: A | Discharge status: Home / Self Care | Payer: BLUE CROSS/BLUE SHIELD | Source: Ambulatory Visit | Attending: Internal Medicine | Admitting: Internal Medicine

## 2016-04-20 DIAGNOSIS — M810 Age-related osteoporosis without current pathological fracture: Secondary | ICD-10-CM

## 2016-05-08 ENCOUNTER — Encounter: Payer: Self-pay | Admitting: Internal Medicine

## 2016-05-09 ENCOUNTER — Encounter: Payer: Self-pay | Admitting: Internal Medicine

## 2016-08-10 ENCOUNTER — Ambulatory Visit: Payer: BLUE CROSS/BLUE SHIELD | Admitting: Internal Medicine

## 2016-09-24 ENCOUNTER — Encounter: Payer: Self-pay | Admitting: Internal Medicine

## 2018-04-12 IMAGING — CT CT CHEST W/O CM
2 of 4 series · 15 of 36 positions shown, 18 images · non-contrast
Comparison: 06/26/2014

CLINICAL DATA: Lung nodule

EXAM:
CT CHEST WITHOUT CONTRAST
TECHNIQUE: Multidetector CT imaging of the chest was performed following the
standard protocol without IV contrast.

[Series 3: chest w/o · axial · non-contrast · 0.63mm/px · z∈[-260,-10]mm · 12 of 120 slices shown, 15 images]
[im 10/120  mediastinal]
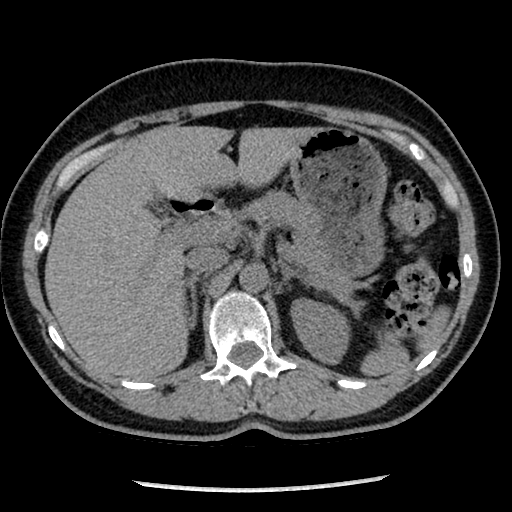
[im 10/120  lung]
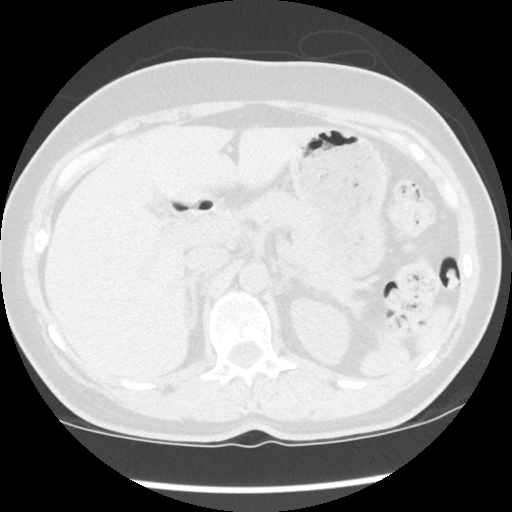
[im 19/120  lung]
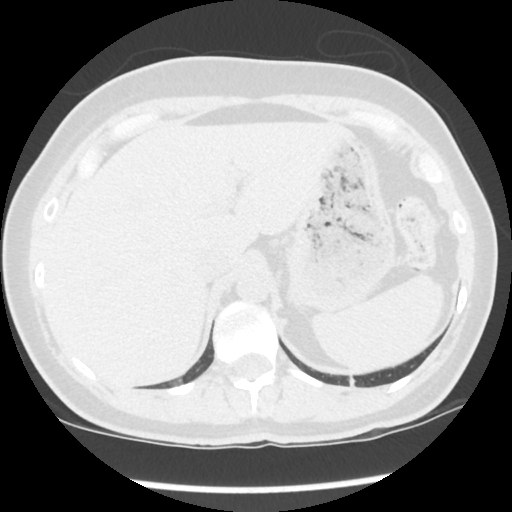
[im 28/120  lung]
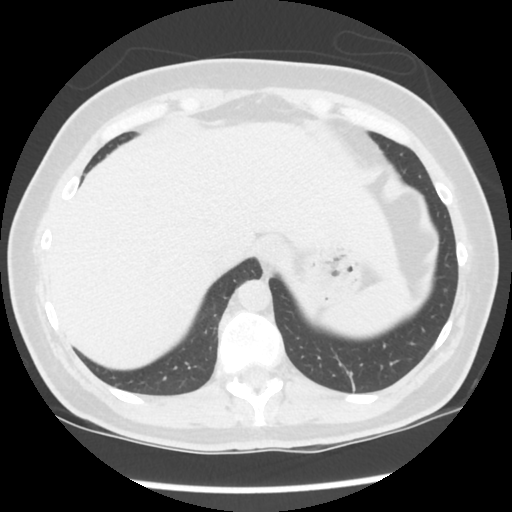
[im 37/120  lung]
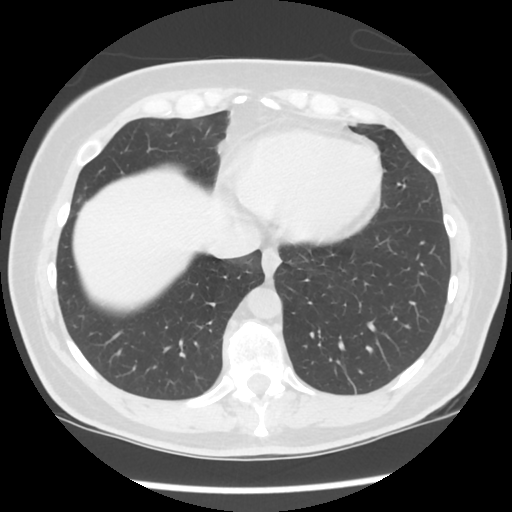
[im 46/120  mediastinal]
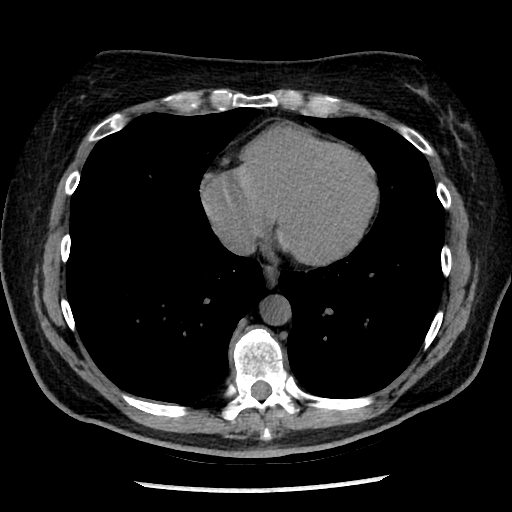
[im 46/120  lung]
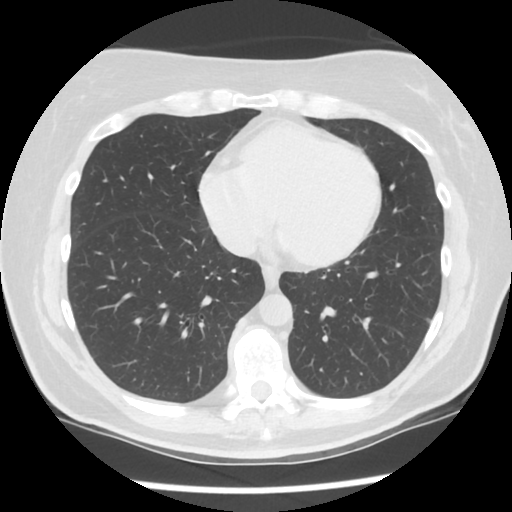
[im 55/120  lung]
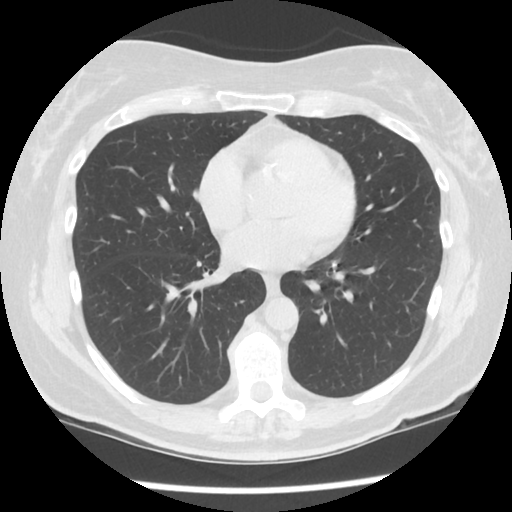
[im 65/120  lung]
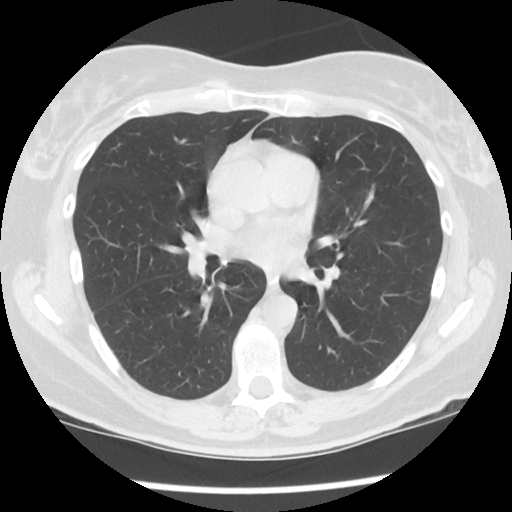
[im 74/120  lung]
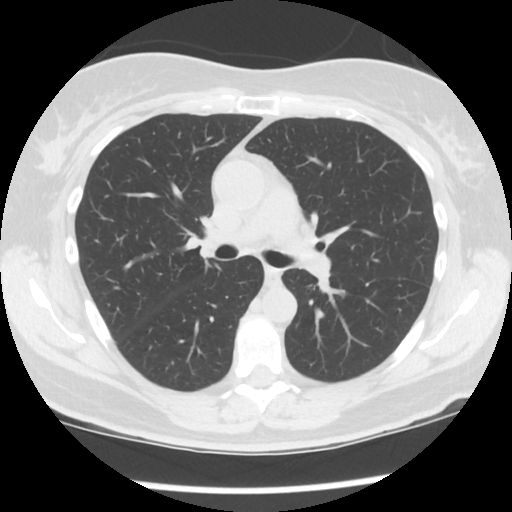
[im 83/120  mediastinal]
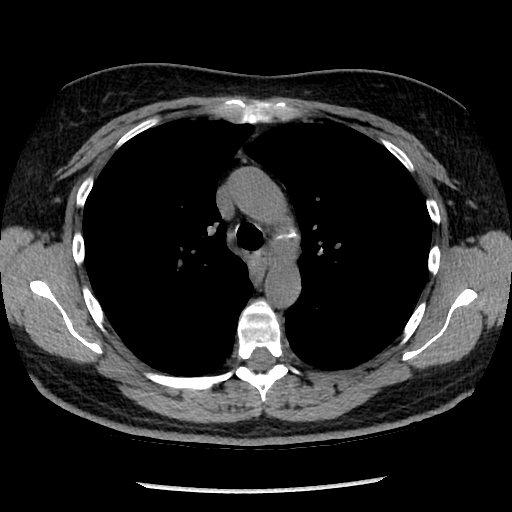
[im 83/120  lung]
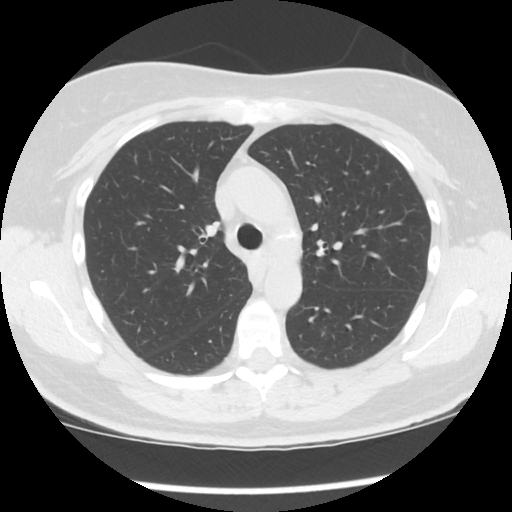
[im 92/120  lung]
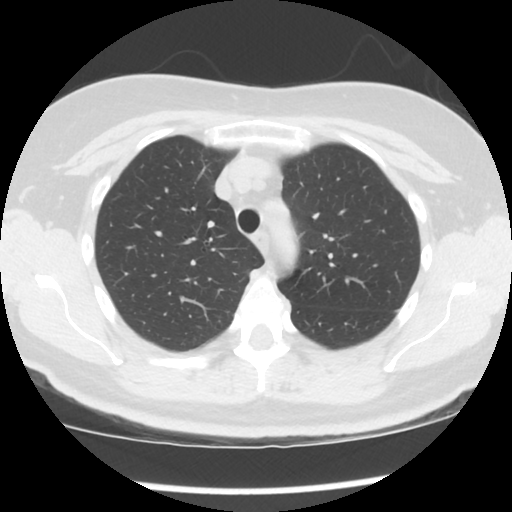
[im 101/120  lung]
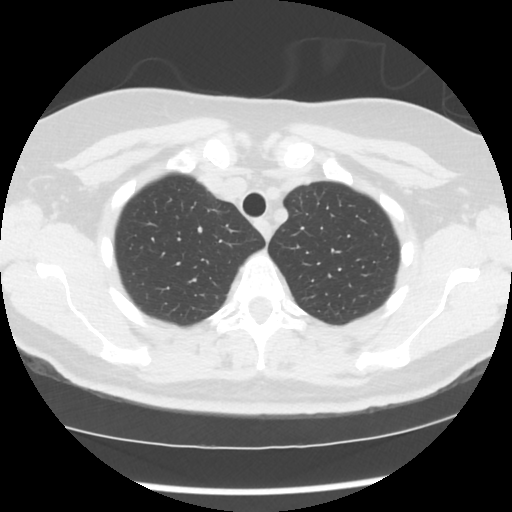
[im 110/120  lung]
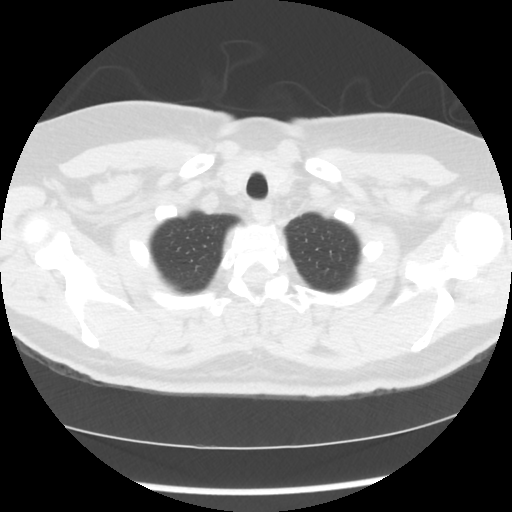

[Series 200: cor · coronal · 0.63mm/px · 3 of 107 slices shown]
[im 22/107  lung]
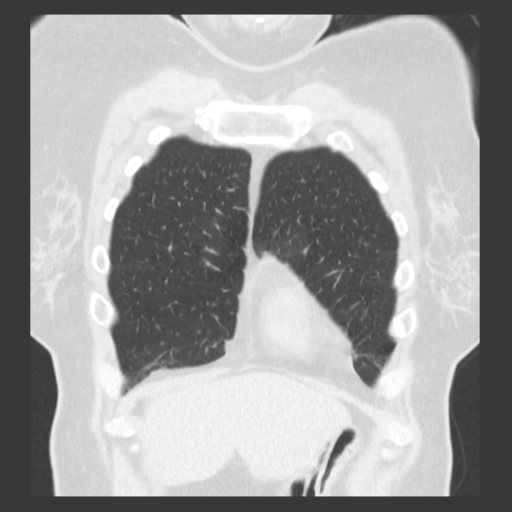
[im 43/107  lung]
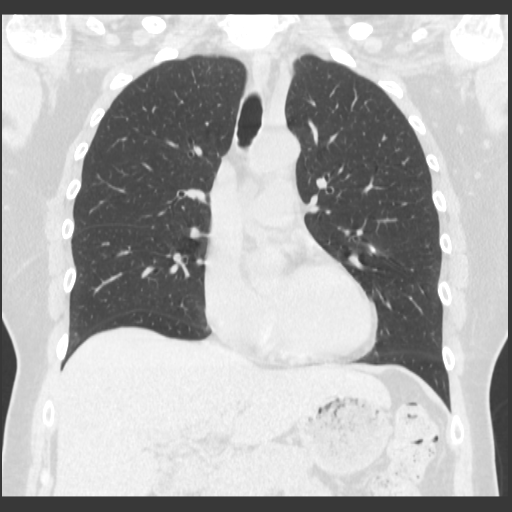
[im 64/107  lung]
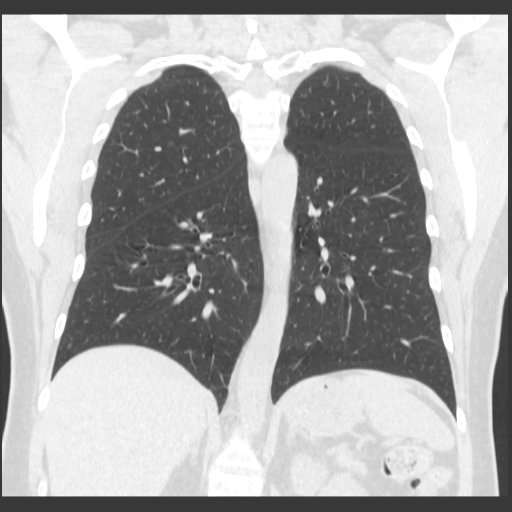

[15 of 36 positions shown; findings below may reference images not displayed]

FINDINGS: Bilateral tiny pulmonary nodules on images 60, 70, 77, and 69 are
stable. The largest is in the left lower lobe and is pleural based
on image 77, measuring 5 mm. There are no new pulmonary nodules.

No abnormal mediastinal adenopathy. No pericardial effusion. Normal
thyroid gland.

No pneumothorax or pleural effusion

No acute bony deformity.

Upper abdomen is within normal limits for noncontrast technique.
IMPRESSION: Stable subcentimeter bilateral pulmonary nodules dating back 1 year.
Stability at the 1 year interval supports benign etiology. There is
no active cardiopulmonary disease.
